# Patient Record
Sex: Male | Born: 1975 | Race: White | Hispanic: No | Smoking: Never smoker
Health system: Southern US, Community
[De-identification: ages and names within clinical notes are randomized; demographics above are authoritative.]

## PROBLEM LIST (undated history)

## (undated) DIAGNOSIS — I10 Essential (primary) hypertension: Secondary | ICD-10-CM

## (undated) DIAGNOSIS — E7800 Pure hypercholesterolemia, unspecified: Secondary | ICD-10-CM

## (undated) DIAGNOSIS — G4730 Sleep apnea, unspecified: Secondary | ICD-10-CM

## (undated) HISTORY — PX: FRACTURE SURGERY: SHX138

## (undated) HISTORY — DX: Sleep apnea, unspecified: G47.30

## (undated) HISTORY — DX: Essential (primary) hypertension: I10

## (undated) HISTORY — PX: LITHOTRIPSY: SUR834

## (undated) HISTORY — DX: Pure hypercholesterolemia, unspecified: E78.00

---

## 2005-06-12 HISTORY — PX: ANKLE FRACTURE SURGERY: SHX122

## 2006-01-03 ENCOUNTER — Emergency Department (HOSPITAL_COMMUNITY): Admission: EM | Admit: 2006-01-03 | Discharge: 2006-01-03 | Payer: Self-pay | Admitting: Emergency Medicine

## 2006-01-08 ENCOUNTER — Ambulatory Visit (HOSPITAL_COMMUNITY): Admission: RE | Admit: 2006-01-08 | Discharge: 2006-01-08 | Payer: Self-pay | Admitting: Orthopedic Surgery

## 2006-01-11 ENCOUNTER — Ambulatory Visit (HOSPITAL_COMMUNITY): Admission: RE | Admit: 2006-01-11 | Discharge: 2006-01-12 | Payer: Self-pay | Admitting: Orthopedic Surgery

## 2006-03-07 ENCOUNTER — Encounter: Admission: RE | Admit: 2006-03-07 | Discharge: 2006-03-07 | Payer: Self-pay | Admitting: Orthopedic Surgery

## 2006-03-26 ENCOUNTER — Ambulatory Visit (HOSPITAL_COMMUNITY): Admission: RE | Admit: 2006-03-26 | Discharge: 2006-03-27 | Payer: Self-pay | Admitting: Orthopedic Surgery

## 2007-05-11 ENCOUNTER — Emergency Department (HOSPITAL_COMMUNITY): Admission: EM | Admit: 2007-05-11 | Discharge: 2007-05-11 | Payer: Self-pay | Admitting: Emergency Medicine

## 2007-05-22 ENCOUNTER — Encounter: Admission: RE | Admit: 2007-05-22 | Discharge: 2007-05-22 | Payer: Self-pay | Admitting: Urology

## 2009-03-21 IMAGING — CT CT UROGRAM
2 of 3 series · 15 of 42 positions shown, 19 images · non-contrast
Comparison: NONE

CLINICAL DATA: Left-sided pain and hematuria. 

CT UROGRAM
TECHNIQUE: Thin-section unenhanced axial images were obtained to 
provide a CT urogram to evaluate for possible urinary tract stone. 
 Scan thicknesses were 3.0 mm with 3.0-mm increments.

[Series 2: wo · axial · 0.76mm/px · z∈[+45,+426]mm · 12 of 143 slices shown, 16 images]
[im 8/143  soft-tissue]
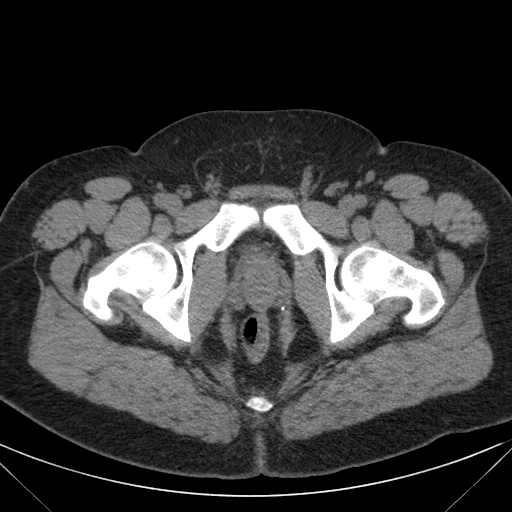
[im 8/143  bone]
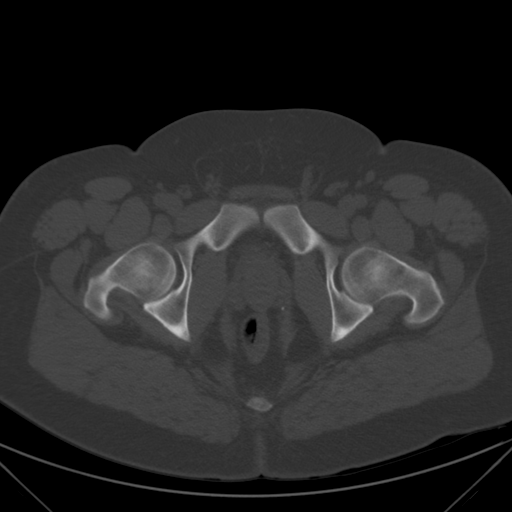
[im 23/143  soft-tissue]
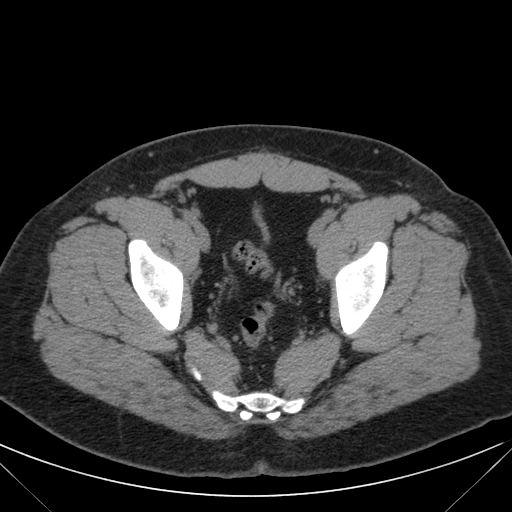
[im 38/143  soft-tissue]
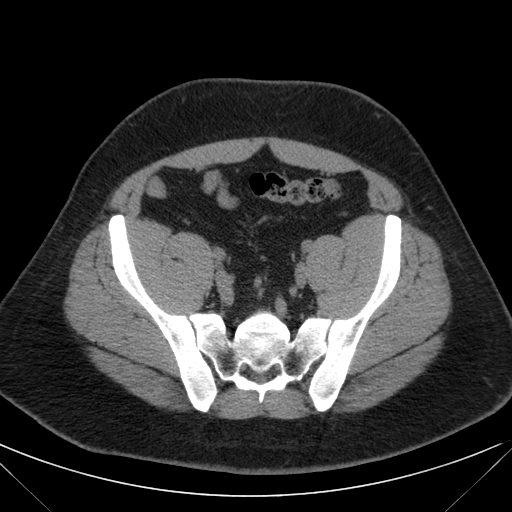
[im 53/143  soft-tissue]
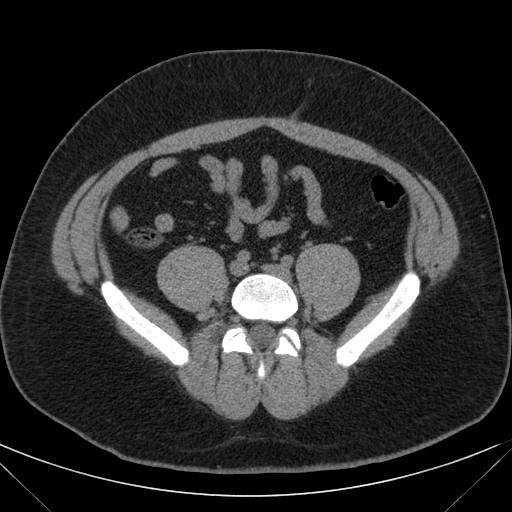
[im 68/143  soft-tissue]
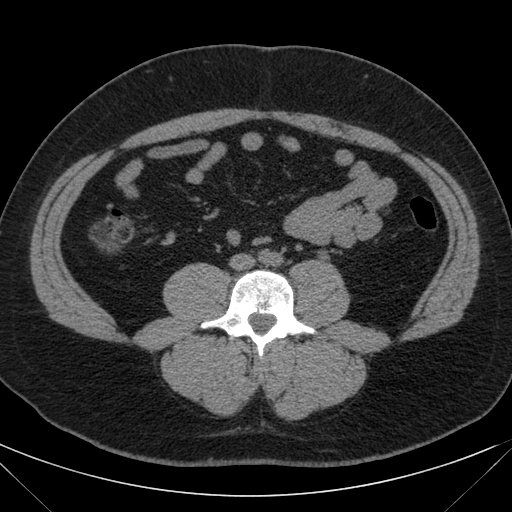
[im 75/143  soft-tissue]
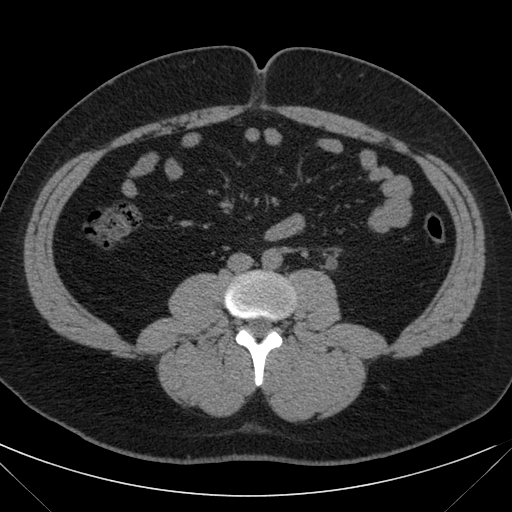
[im 90/143  soft-tissue]
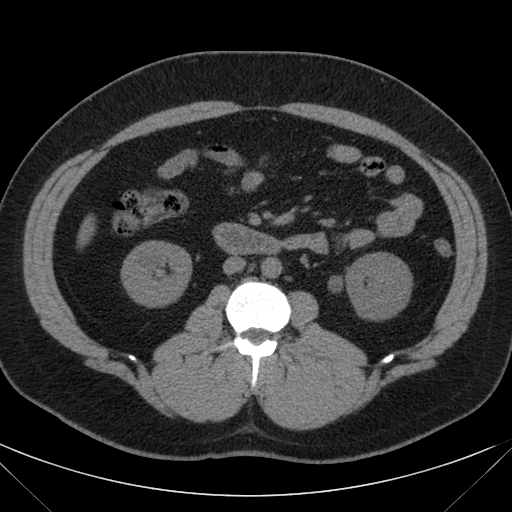
[im 105/143  soft-tissue]
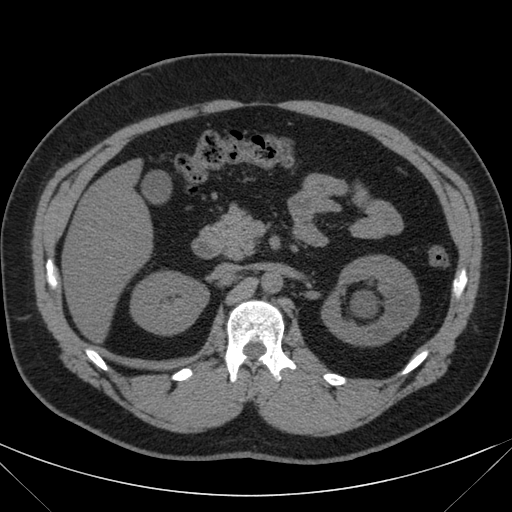
[im 113/143  lung]
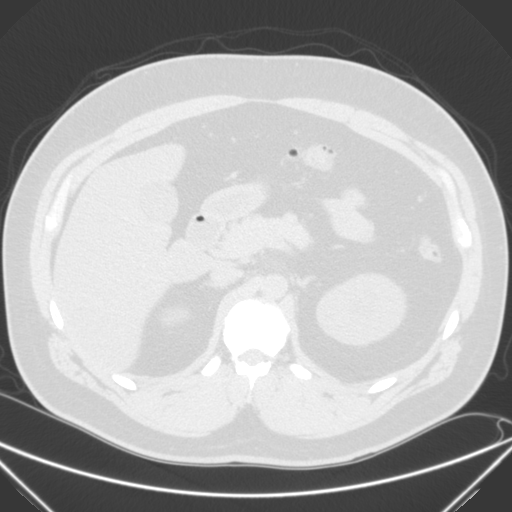
[im 120/143  soft-tissue]
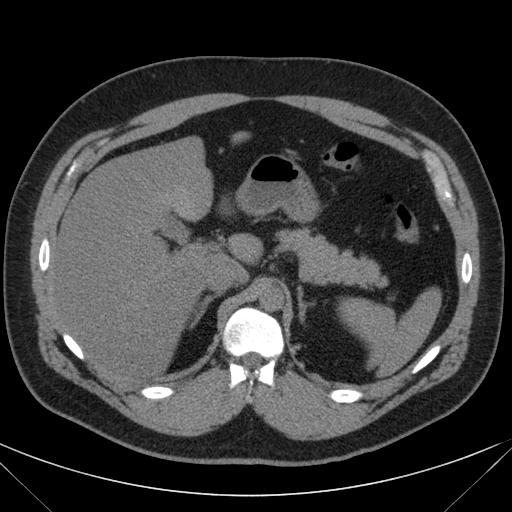
[im 120/143  lung]
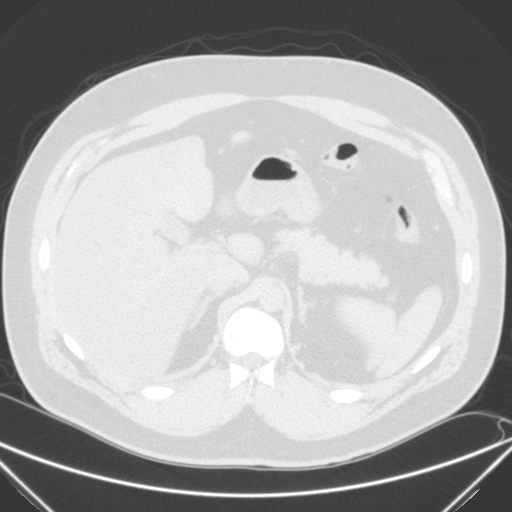
[im 120/143  bone]
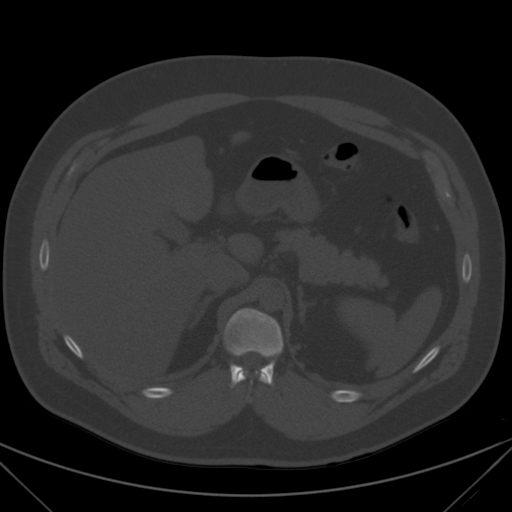
[im 128/143  lung]
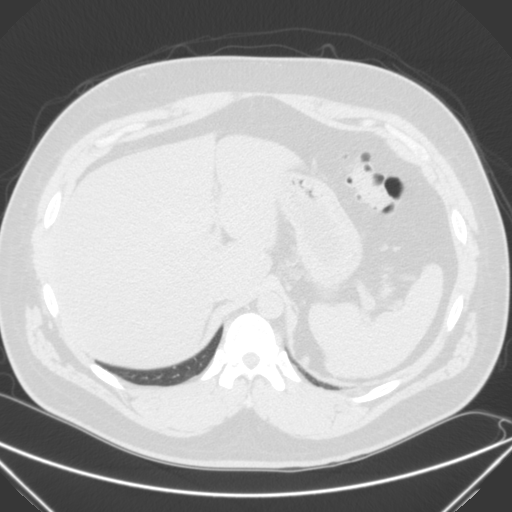
[im 135/143  soft-tissue]
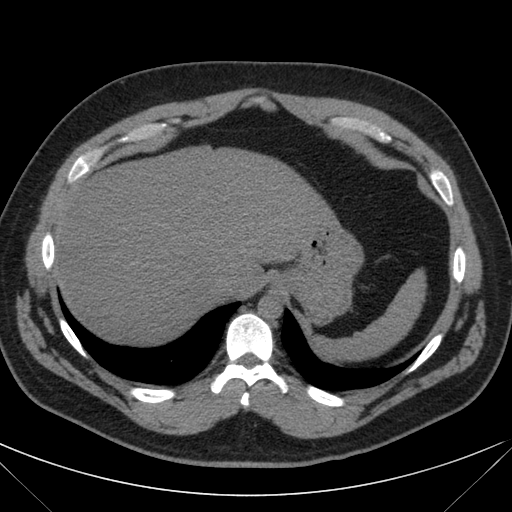
[im 135/143  lung]
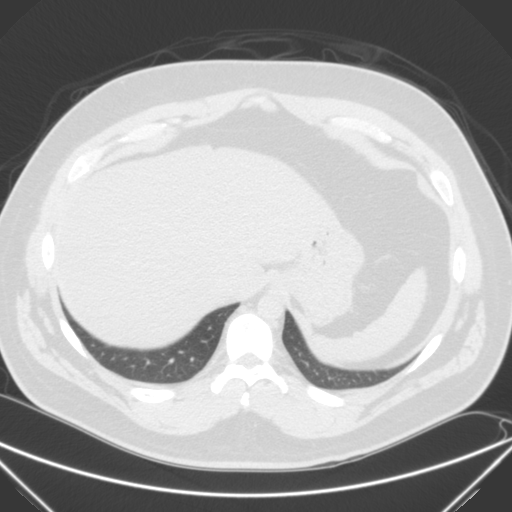

[coronals · coronal · 0.83mm/px · 3 of 82 slices shown]
[im 28/82  soft-tissue]
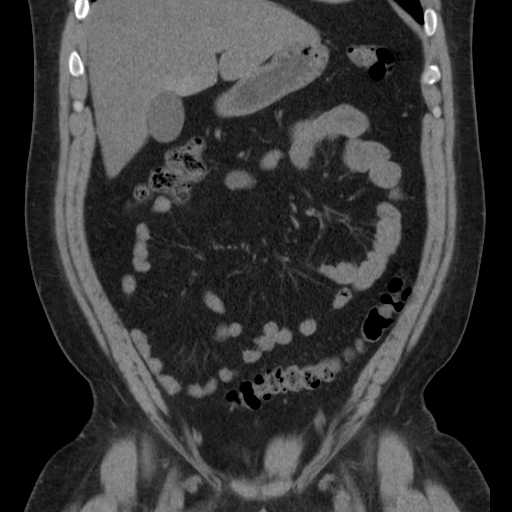
[im 37/82  soft-tissue]
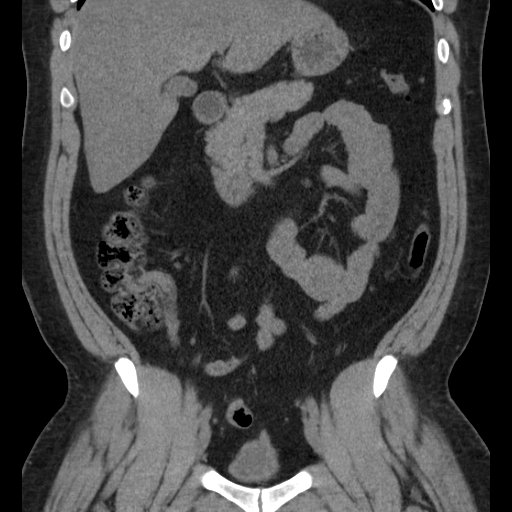
[im 46/82  soft-tissue]
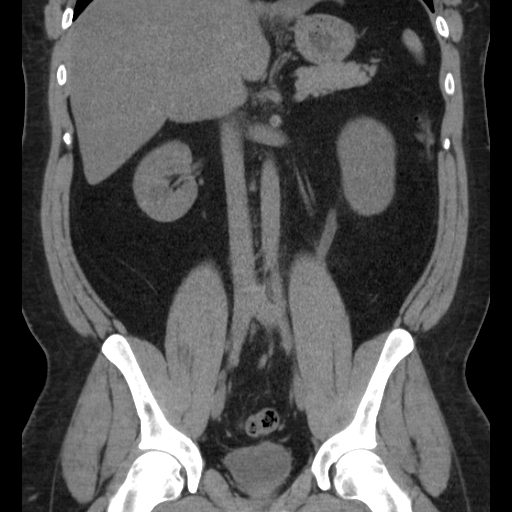

[15 of 42 positions shown; findings below may reference images not displayed]

FINDINGS: No gallstones or right renal calculi. There is marked 
caliectasis and ureterectasis on the left to the level of the 
proximal ureter, where there is a 4-5 mm calculus.  Note is also 
made of a large calculus in the midpole region of the left kidney, 
which measures at least 12 mm in size. No other ureteral calculi 
are noted. Visualized portions of the liver, pancreas, and spleen 
are unremarkable.  There is no evidence of abdominal aortic 
aneurysm.  No evidence of appendicitis, diverticulitis, hernia, or 
bowel obstruction.
IMPRESSION: Large left renal calculus. Obstructing proximal left 
ureteral calculus, 4-5 mm in size. Goldstein, [place] 
electronically reviewed on 05/13/2007 Dict Date: 05/13/2007  Tran 
Date:  05/13/2007 DAS  JLM

## 2009-06-28 ENCOUNTER — Ambulatory Visit (HOSPITAL_COMMUNITY): Admission: RE | Admit: 2009-06-28 | Discharge: 2009-06-28 | Payer: Self-pay | Admitting: Urology

## 2010-10-18 ENCOUNTER — Ambulatory Visit (HOSPITAL_BASED_OUTPATIENT_CLINIC_OR_DEPARTMENT_OTHER)
Admission: RE | Admit: 2010-10-18 | Discharge: 2010-10-18 | Disposition: A | Discharge status: Home / Self Care | Source: Ambulatory Visit | Attending: Plastic Surgery | Admitting: Plastic Surgery

## 2010-10-18 ENCOUNTER — Other Ambulatory Visit: Payer: Self-pay | Admitting: Plastic Surgery

## 2010-10-18 DIAGNOSIS — Z01810 Encounter for preprocedural cardiovascular examination: Secondary | ICD-10-CM | POA: Insufficient documentation

## 2010-10-18 DIAGNOSIS — D1739 Benign lipomatous neoplasm of skin and subcutaneous tissue of other sites: Secondary | ICD-10-CM | POA: Insufficient documentation

## 2010-10-18 LAB — POCT I-STAT, CHEM 8
BUN: 13 mg/dL (ref 6–23)
Chloride: 107 mEq/L (ref 96–112)
Glucose, Bld: 124 mg/dL — ABNORMAL HIGH (ref 70–99)
HCT: 44 % (ref 39.0–52.0)
Potassium: 4 mEq/L (ref 3.5–5.1)

## 2010-10-18 LAB — GLUCOSE, CAPILLARY: Glucose-Capillary: 113 mg/dL — ABNORMAL HIGH (ref 70–99)

## 2010-10-28 NOTE — Op Note (Signed)
Eric Farley, Eric Farley                ACCOUNT NO.:  [id number]   MEDICAL RECORD NO.:  [id number]          PATIENT TYPE:  OIB   LOCATION:  5002                         FACILITY:  [facility]   PHYSICIAN:  Doralee Albino. Carola Frost, M.D. DATE OF BIRTH:  1976/03/24   DATE OF PROCEDURE:  01/11/2006  DATE OF DISCHARGE:  01/12/2006                                 OPERATIVE REPORT   DIAGNOSES:  1. Left proximal fibular fracture.  2. Disrupted left ankle syndesmosis.   PROCEDURE:  Open treatment of left ankle syndesmosis using two screws.   SURGEON:  Doralee Albino. Carola Frost, M.D.   ASSISTANT:  Aura Fey. Bobbe Medico.   ANESTHESIA:  General.   COMPLICATIONS:  None.   TOURNIQUET:  None.   DISPOSITION:  To PACU.   CONDITION:  Stable.   BRIEF SUMMARY OF INDICATIONS FOR PROCEDURE:  Eric Farley is a 35 year old  male who sustained a severe left ankle injury at work.  X-rays demonstrated  complete widening of the tibiofibular space and translation of the talus  consistent with complete disruption of the syndesmosis.  After waiting for  clearance for surgery, Eric Farley was ready to proceed.  We discussed  preoperatively and he understood the risk of nerve injury, vessel injury,  infection, thromboembolic complications, possibility of loss of reduction  and symptomatic hardware, hardware breakage and need for further surgery  including possible hardware removal at a later date.  He wished to proceed.   DESCRIPTION OF PROCEDURE:  Eric Farley was taken to the operating room after  administration of preop antibiotics.  General anesthesia was induced.  His  left lower extremity was prepped and draped in the usual sterile fashion.  We then manipulated the ankle and attempted to reduce the syndesmosis given  the amount of time between his fracture and surgery.  We did need to use the  [facility] tong clamp which was placed through percutaneous stab incisions over  the fibula and medial metaphysis in order to compress and  reduce the  syndesmosis.  Appropriate reduction was checked on AP, mortise and lateral  films and once confirmed appropriate, two tricortical screws were placed  from the fibula into the tibia.  Final AP, mortise and lateral film showed  appropriate reduction and hardware placement.  The small incisions were then  irrigated and closed with 3-0 nylon.  Sterile gently compressive dressing  and then a posterior and stirrup splint were applied.  The patient was  awakened from anesthesia and taken to PACU in stable condition.   PROGNOSIS:  Eric Farley will most likely stay overnight given his sleep  apnea.  He should do well following this injury provided that he maintains  his nonweightbearing restrictions.  He should not need formal DVT  prophylaxis as he has been up and quite mobile.  He will be discharged home  on aspirin daily and of Percocet for pain control.  Will plan to see him  back in the clinic in 10 days or so and at that time convert him into a cam  boot to allow for mobilization of the ankle.  Doralee Albino. Carola Frost, M.D.  Electronically Signed     MHH/MEDQ  D:  01/25/2006  T:  01/25/2006  Job:  [id number]

## 2010-10-28 NOTE — Op Note (Signed)
[name], [name]                ACCOUNT NO.:  [id number]   MEDICAL RECORD NO.:  [id number]          PATIENT TYPE:  OIB   LOCATION:  5031                         FACILITY:  [facility]   PHYSICIAN:  Doralee Albino. Carola Frost, M.D. DATE OF BIRTH:  05/30/1976   DATE OF PROCEDURE:  03/27/2006  DATE OF DISCHARGE:  03/27/2006                                 OPERATIVE REPORT   PREOPERATIVE DIAGNOSIS:  Loss of reduction left ankle syndesmosis.   POSTOPERATIVE DIAGNOSIS:  Loss of reduction left ankle syndesmosis.   PROCEDURE:  1. Revision open reduction and internal fixation of left ankle      syndesmosis.  2. Removal of deep implant, left ankle.   SURGEON:  Doralee Albino. Carola Frost, M.D.   ASSISTANT:   ANESTHESIA:  General.   COMPLICATIONS:  None.   TOURNIQUET:  None.   ESTIMATED BLOOD LOSS:  Scant.   DISPOSITION:  To PACU.   CONDITION:  Stable.   INDICATIONS FOR PROCEDURE:  Eric Farley is a 35 year old male who sustained  a left ankle [place] fracture with disruption of the syndesmosis and an  associated deltoid ligament tear.  He was non-compliant in the postoperative  period early on but subsequently became compliant. He began very limited  partial weight-bearing around eight weeks and developed hardware breakage of  his syndesmotic screws with loss of reduction as confirmed by CT with slight  widening, around 5 mm.  After a discussion of the risks and benefits of  surgery, Eric Farley elected to proceed with removal of the fracture  hardware and revision internal fixation and reduction of the syndesmosis.   DESCRIPTION OF PROCEDURE:  Eric Farley was administered preop antibiotics  and taken to the operating room where general anesthesia was induced.  His  left lower extremity was prepped and draped in the usual sterile fashion.  The old lateral incision was reopened, had the screws identified, and the  screws removed without difficulty.  It should be noted prior to making an  incision, we  did perform a stress examination of the syndesmosis which  showed obvious widening of the syndesmotic interval with external rotation  stress.  The [facility] tong clamps were then used to help reduce and maintain  reduction which was confirmed on both AP and mortise x-rays using  appropriate landmarks.  Two screws were then placed from lateral to medial  obtaining of four cortices on the most proximal screw and being in the hard  subchondral bone distally.  No attempt was made to remove the interosseus  broken screw tip within the tibia.  The wound was copiously irrigated and  closed in a standard layered fashion using 2-0 Vicryl for the periosteum, 2-  0 PDS for the subcu layer, and 3-0 nylon for the skin.  A sterile gently  compressive dressing was applied.  The patient was then placed into a well  padded dressing and posterior stirrup splint with the ankle in equinus.  He  was then awakened from anesthesia and transported to the PACU in stable  condition.   PROGNOSIS:  Eric Farley fortunately did not require debridement  of the  syndesmotic interval nor opening of the medial tibiotalar joint.  Because we  were able to revise his fixation within a reasonable time period from loss  of reduction, this should translate into an uneventful recovery if he can  maintain non-weight bearing for the next eight weeks followed by partial  graduated weight bearing.  I will initially place him in a splint but will  begin allowing range of motion at two weeks.      Doralee Albino. Carola Frost, M.D.  Electronically Signed     MHH/MEDQ  D:  03/26/2006  T:  03/27/2006  Job:  [id number]

## 2011-05-28 ENCOUNTER — Ambulatory Visit: Payer: BC Managed Care – PPO

## 2011-05-28 DIAGNOSIS — J019 Acute sinusitis, unspecified: Secondary | ICD-10-CM

## 2011-07-20 ENCOUNTER — Telehealth: Payer: Self-pay

## 2011-07-20 NOTE — Telephone Encounter (Signed)
PT STATES HE CANNOT TASTE FOODS AS WELL ANYMORE, SOME DRY MOUTH AND NOTICES METALLIC AFTER TASTE AND TONGUE FEELS SORE

## 2011-07-21 NOTE — Telephone Encounter (Signed)
Spoke with pt and advised to rtc for eval. Pt understood and will be here today or tomorrow

## 2011-07-23 ENCOUNTER — Ambulatory Visit: Payer: BC Managed Care – PPO | Admitting: Family Medicine

## 2011-07-23 ENCOUNTER — Emergency Department (HOSPITAL_BASED_OUTPATIENT_CLINIC_OR_DEPARTMENT_OTHER)
Admission: EM | Admit: 2011-07-23 | Discharge: 2011-07-23 | Disposition: A | Discharge status: Home / Self Care | Payer: BC Managed Care – PPO | Attending: Emergency Medicine | Admitting: Emergency Medicine

## 2011-07-23 ENCOUNTER — Encounter (HOSPITAL_BASED_OUTPATIENT_CLINIC_OR_DEPARTMENT_OTHER): Payer: Self-pay

## 2011-07-23 DIAGNOSIS — Z79899 Other long term (current) drug therapy: Secondary | ICD-10-CM | POA: Insufficient documentation

## 2011-07-23 DIAGNOSIS — G4730 Sleep apnea, unspecified: Secondary | ICD-10-CM | POA: Insufficient documentation

## 2011-07-23 DIAGNOSIS — E1165 Type 2 diabetes mellitus with hyperglycemia: Secondary | ICD-10-CM

## 2011-07-23 DIAGNOSIS — E7800 Pure hypercholesterolemia, unspecified: Secondary | ICD-10-CM

## 2011-07-23 DIAGNOSIS — K219 Gastro-esophageal reflux disease without esophagitis: Secondary | ICD-10-CM

## 2011-07-23 DIAGNOSIS — I10 Essential (primary) hypertension: Secondary | ICD-10-CM | POA: Insufficient documentation

## 2011-07-23 DIAGNOSIS — N529 Male erectile dysfunction, unspecified: Secondary | ICD-10-CM

## 2011-07-23 DIAGNOSIS — E782 Mixed hyperlipidemia: Secondary | ICD-10-CM

## 2011-07-23 DIAGNOSIS — R739 Hyperglycemia, unspecified: Secondary | ICD-10-CM

## 2011-07-23 DIAGNOSIS — Z794 Long term (current) use of insulin: Secondary | ICD-10-CM | POA: Insufficient documentation

## 2011-07-23 DIAGNOSIS — E119 Type 2 diabetes mellitus without complications: Secondary | ICD-10-CM

## 2011-07-23 DIAGNOSIS — R5383 Other fatigue: Secondary | ICD-10-CM

## 2011-07-23 DIAGNOSIS — E1169 Type 2 diabetes mellitus with other specified complication: Secondary | ICD-10-CM | POA: Insufficient documentation

## 2011-07-23 LAB — LIPID PANEL
Cholesterol: 170 mg/dL (ref 0–200)
Total CHOL/HDL Ratio: 5.9 Ratio
Triglycerides: 776 mg/dL — ABNORMAL HIGH (ref <150)

## 2011-07-23 LAB — URINALYSIS, ROUTINE W REFLEX MICROSCOPIC
Glucose, UA: >1000 mg/dL — AB
Leukocytes, UA: NEGATIVE
Nitrite: NEGATIVE
Protein, ur: 100 mg/dL — AB
Urobilinogen, UA: 0.2 mg/dL (ref 0.0–1.0)

## 2011-07-23 LAB — GLUCOSE, CAPILLARY
Glucose-Capillary: 591 mg/dL (ref 70–99)
Glucose-Capillary: 458 mg/dL — ABNORMAL HIGH (ref 70–99)
Glucose-Capillary: 388 mg/dL — ABNORMAL HIGH (ref 70–99)
Glucose-Capillary: 340 mg/dL — ABNORMAL HIGH (ref 70–99)

## 2011-07-23 LAB — COMPREHENSIVE METABOLIC PANEL
Albumin: 4.6 g/dL (ref 3.5–5.2)
BUN: 18 mg/dL (ref 6–23)
Calcium: 9.5 mg/dL (ref 8.4–10.5)
Chloride: 86 mEq/L — ABNORMAL LOW (ref 96–112)
Creat: 1.14 mg/dL (ref 0.50–1.35)
Glucose, Bld: 489 mg/dL — ABNORMAL HIGH (ref 70–99)
Potassium: 4.2 mEq/L (ref 3.5–5.3)

## 2011-07-23 LAB — POCT URINALYSIS DIPSTICK
Leukocytes, UA: NEGATIVE
Nitrite, UA: NEGATIVE
Spec Grav, UA: 1.01
Urobilinogen, UA: 0.2

## 2011-07-23 LAB — BASIC METABOLIC PANEL
CO2: 22 mEq/L (ref 19–32)
Calcium: 9.8 mg/dL (ref 8.4–10.5)
Chloride: 81 mEq/L — ABNORMAL LOW (ref 96–112)
GFR calc Af Amer: >90 mL/min (ref >90)
Sodium: 125 mEq/L — ABNORMAL LOW (ref 135–145)

## 2011-07-23 LAB — DIFFERENTIAL
Basophils Relative: 0 % (ref 0–1)
Lymphs Abs: 2.7 10*3/uL (ref 0.7–4.0)
Monocytes Relative: 9 % (ref 3–12)
Neutro Abs: 6.2 10*3/uL (ref 1.7–7.7)
Neutrophils Relative %: 63 % (ref 43–77)

## 2011-07-23 LAB — POCT UA - MICROSCOPIC ONLY
Casts, Ur, LPF, POC: NEGATIVE
Crystals, Ur, HPF, POC: NEGATIVE
Yeast, UA: NEGATIVE

## 2011-07-23 LAB — POCT GLYCOSYLATED HEMOGLOBIN (HGB A1C): Hemoglobin A1C: >14

## 2011-07-23 LAB — CBC
Hemoglobin: 16.2 g/dL (ref 13.0–17.0)
RBC: 6.24 MIL/uL — ABNORMAL HIGH (ref 4.22–5.81)

## 2011-07-23 LAB — HEMOGLOBIN A1C: Hgb A1c MFr Bld: 14.5 % — ABNORMAL HIGH (ref <5.7)

## 2011-07-23 LAB — URINE MICROSCOPIC-ADD ON

## 2011-07-23 MED ORDER — OMEPRAZOLE 20 MG PO CPDR: 20.0000 mg | DELAYED_RELEASE_CAPSULE | Freq: Every day | ORAL | Status: DC

## 2011-07-23 MED ORDER — INSULIN PEN NEEDLE 31G X 8 MM MISC: 1.0000 | Freq: Every day | Status: DC

## 2011-07-23 MED ORDER — INSULIN GLARGINE 100 UNIT/ML ~~LOC~~ SOLN: SUBCUTANEOUS | Status: DC

## 2011-07-23 MED ORDER — LISINOPRIL-HYDROCHLOROTHIAZIDE 10-12.5 MG PO TABS: 1.0000 | ORAL_TABLET | Freq: Every day | ORAL | Status: DC

## 2011-07-23 MED ORDER — METFORMIN HCL 1000 MG PO TABS: 1000.0000 mg | ORAL_TABLET | Freq: Two times a day (BID) | ORAL | Status: DC

## 2011-07-23 MED ORDER — SODIUM CHLORIDE 0.9 % IV BOLUS (SEPSIS): 1000.0000 mL | Freq: Once | INTRAVENOUS | Status: DC

## 2011-07-23 MED ORDER — SIMVASTATIN 40 MG PO TABS: 40.0000 mg | ORAL_TABLET | Freq: Every evening | ORAL | Status: DC

## 2011-07-23 MED ORDER — SODIUM CHLORIDE 0.9 % IV BOLUS (SEPSIS)
1000.0000 mL | Freq: Once | INTRAVENOUS | Status: AC
  Administered 2011-07-23: 1000 mL via INTRAVENOUS

## 2011-07-23 MED ORDER — SODIUM CHLORIDE 0.9 % IV SOLN
Freq: Once | INTRAVENOUS | Status: AC
  Administered 2011-07-23: 14:00:00 via INTRAVENOUS

## 2011-07-23 MED ORDER — INSULIN REGULAR HUMAN 100 UNIT/ML IJ SOLN
10.0000 [IU] | Freq: Once | INTRAMUSCULAR | Status: AC
  Administered 2011-07-23: 10 [IU] via INTRAVENOUS
  Filled 2011-07-23: qty 3

## 2011-07-23 NOTE — ED Notes (Signed)
The patients CBG was 591.

## 2011-07-23 NOTE — Progress Notes (Signed)
Subjective:    Patient ID: Eric Farley, male    DOB: 19-Jul-1975, 36 y.o.   MRN: [id number]  Diabetes He presents for his follow-up diabetic visit. He has type 2 diabetes mellitus. No MedicAlert identification noted. His disease course has been worsening. Associated symptoms include fatigue, foot paresthesias (rare), polydipsia (signifcantly worsening over the last 2 wks), polyuria (at least once a hr, significantly worse over last 2 weeks), weakness and weight loss (planned with diet and exercise). Pertinent negatives for diabetes include no blurred vision, no chest pain, no foot ulcerations, no polyphagia and no visual change. Symptoms are worsening. Symptoms have been present for 2 weeks. Current diabetic treatment includes diet and oral agent (monotherapy). He is compliant with treatment all of the time. His weight is decreasing steadily. He monitors blood glucose at home 1-2 x per week. His overall blood glucose range is >200 mg/dl. An ACE inhibitor/angiotensin II receptor blocker is being taken.  Erectile Dysfunction This is a new problem. The current episode started 1 to 4 weeks ago. The problem has been gradually worsening since onset. The nature of his difficulty is achieving erection, maintaining erection and penetration. Irritative symptoms include frequency. Past treatments include nothing. Risk factors include diabetes mellitus.  Pt also experiencing significant fatigue and lack of energy.    Review of Systems  Constitutional: Positive for weight loss (planned with diet and exercise) and fatigue.  Eyes: Negative for blurred vision.  Cardiovascular: Negative for chest pain.  Genitourinary: Positive for polyuria (at least once a hr, significantly worse over last 2 weeks) and frequency.  Neurological: Positive for weakness.  Hematological: Positive for polydipsia (signifcantly worsening over the last 2 wks). Negative for polyphagia.       Objective:   Physical Exam    Constitutional: He is oriented to person, place, and time. He appears well-developed and well-nourished.  HENT:  Head: Normocephalic and atraumatic.  Right Ear: External ear normal.  Left Ear: External ear normal.  Eyes: Pupils are equal, round, and reactive to light.  Cardiovascular: Normal rate, regular rhythm and normal heart sounds.   Pulmonary/Chest: Effort normal and breath sounds normal.  Neurological: He is alert and oriented to person, place, and time. No sensory deficit.  Skin: Skin is warm and dry.  Psychiatric: He has a normal mood and affect. His behavior is normal. Judgment and thought content normal.   -normal sensation with filament testing B feet.       Assessment & Plan:   1. DM type 2 (diabetes mellitus, type 2)  POCT glucose (manual entry), POCT glycosylated hemoglobin (Hb A1C), Lipid panel, Comprehensive metabolic panel, Testosterone, free, total, POCT UA - Microscopic Only, POCT urinalysis dipstick, Hemoglobin A1c  2. HTN (hypertension)  Comprehensive metabolic panel, lisinopril-hydrochlorothiazide (PRINZIDE,ZESTORETIC) 10-12.5 MG per tablet  3. Hypercholesteremia  Lipid panel, simvastatin (ZOCOR) 40 MG tablet  4. Fatigue  Testosterone, free, total  5. Impotence  Testosterone, free, total  6. DM (diabetes mellitus), type 2, uncontrolled  metFORMIN (GLUCOPHAGE) 1000 MG tablet, insulin glargine (LANTUS SOLOSTAR) 100 UNIT/ML injection, Insulin Pen Needle 31G X 8 MM MISC  7. GERD (gastroesophageal reflux disease)  omeprazole (PRILOSEC) 20 MG capsule    Meds refilled.  DM significantly worse since last OV 11/12 where HgA1C 7.7 and todays >14 with glucose >444 worried that he has almost no pancreatic function and suffering from dehydration with significant weight loss (~30lbs in <8 wks) - pt will go to ED for fluids and insulin along with accurate glucose reading.  Will start Lantus at home and increase metformin.  Pt will f/u in 1 wk to monitor status.

## 2011-07-23 NOTE — ED Provider Notes (Signed)
History     CSN: [id number]  Arrival date & time 07/23/11  1153   First MD Initiated Contact with Patient 07/23/11 1308      Chief Complaint  Patient presents with  . Blood Sugar Problem    (Consider location/radiation/quality/duration/timing/severity/associated sxs/prior treatment) The history is provided by the patient.   36 year old male was diagnosed as being diabetic 6 months ago. For the last 2 weeks, he has had polyuria and polydipsia. He has been checking his sugars and they have been running over 300. Yesterday his blood sugar at home just read high. He went to urgent care today where he was told that his pancreas is not making any insulin and he had to come to the emergency department. He denies fever, chills, sweats. He denies any nausea, vomiting, diarrhea. Symptoms are severe. Nothing makes it better nothing makes it worse. He is currently taking metformin 500 mg twice a day. He was given an insulin pen by urgent care before being sent to the emergency department. He denies any blurred vision. He also has history of hypertension and hyperlipidemia.  Past Medical History  Diagnosis Date  . Sleep apnea     does not like CPAP  . HTN (hypertension)   . Hypercholesterolemia   . Diabetes mellitus     Past Surgical History  Procedure Date  . Ankle fracture surgery 2007  . Lithotripsy     History reviewed. No pertinent family history.  History  Substance Use Topics  . Smoking status: Never Smoker   . Smokeless tobacco: Not on file  . Alcohol Use: No      Review of Systems  All other systems reviewed and are negative.    Allergies  Review of patient's allergies indicates no known allergies.  Home Medications   Current Outpatient Rx  Name Route Sig Dispense Refill  . INSULIN GLARGINE 100 UNIT/ML [place] SOLN  Pt to start at 15U qhs and increase by 2 units every 2 nights until am glucose <140 5 pen 0  . INSULIN PEN NEEDLE 31G X 8 MM MISC Does not apply 1 each by  Does not apply route daily. 100 each 0  . LISINOPRIL-HYDROCHLOROTHIAZIDE 10-12.5 MG PO TABS Oral Take 1 tablet by mouth daily. 30 tablet 2  . METFORMIN HCL 1000 MG PO TABS Oral Take 1 tablet (1,000 mg total) by mouth 2 (two) times daily with a meal. 60 tablet 2  . OMEPRAZOLE 20 MG PO CPDR Oral Take 1 capsule (20 mg total) by mouth daily. 30 capsule 2  . SIMVASTATIN 40 MG PO TABS Oral Take 1 tablet (40 mg total) by mouth every evening. 30 tablet 2    BP 148/94  Pulse 89  Temp(Src) 98.3 F (36.8 C) (Oral)  Resp 16  Ht 5\' 9"  (1.753 m)  Wt 240 lb (108.863 kg)  BMI 35.44 kg/m2  SpO2 98%  Physical Exam  Nursing note and vitals reviewed.  overweight 36 year old male who is resting comfortably and in no acute distress. Vital signs show mild hypertension with blood pressure 140/94. Oxygen saturation is 98% which is normal. Head is normocephalic and atraumatic. PERRLA, EOMI. Done extremities are moist. Neck is supple without adenopathy or JVD. Back is nontender. Lungs are clear without rales, wheezes, or rhonchi. Heart has regular rate and rhythm without murmur. Abdomen is obese, soft, nontender without masses or hepatosplenomegaly. Extremities have no cyanosis or edema, full range of motion is present. Skin is warm and dry without rash. Neurologic: Mental status  is normal, cranial nerves are intact, there no focal motor or sensory deficits.  ED Course  Procedures (including critical care time)  Results for orders placed during the hospital encounter of 07/23/11  GLUCOSE, CAPILLARY      Component Value Range   Glucose-Capillary 591 (*) 70 - 99 (mg/dL)   Comment 1 Notify RN     Comment 2 Documented in Chart    CBC      Component Value Range   WBC 9.9  4.0 - 10.5 (K/uL)   RBC 6.24 (*) 4.22 - 5.81 (MIL/uL)   Hemoglobin 16.2  13.0 - 17.0 (g/dL)   HCT 65.7  84.6 - 96.2 (%)   MCV 72.4 (*) 78.0 - 100.0 (fL)   MCH 26.0  26.0 - 34.0 (pg)   MCHC 35.8  30.0 - 36.0 (g/dL)   RDW 95.2  84.1 - 32.4 (%)    Platelets 339  150 - 400 (K/uL)  DIFFERENTIAL      Component Value Range   Neutrophils Relative 63  43 - 77 (%)   Neutro Abs 6.2  1.7 - 7.7 (K/uL)   Lymphocytes Relative 28  12 - 46 (%)   Lymphs Abs 2.7  0.7 - 4.0 (K/uL)   Monocytes Relative 9  3 - 12 (%)   Monocytes Absolute 0.9  0.1 - 1.0 (K/uL)   Eosinophils Relative 1  0 - 5 (%)   Eosinophils Absolute 0.1  0.0 - 0.7 (K/uL)   Basophils Relative 0  0 - 1 (%)   Basophils Absolute 0.0  0.0 - 0.1 (K/uL)   WBC Morphology ATYPICAL LYMPHOCYTES     RBC Morphology POLYCHROMASIA PRESENT    BASIC METABOLIC PANEL      Component Value Range   Sodium 125 (*) 135 - 145 (mEq/L)   Potassium 3.5  3.5 - 5.1 (mEq/L)   Chloride 81 (*) 96 - 112 (mEq/L)   CO2 22  19 - 32 (mEq/L)   Glucose, Bld 485 (*) 70 - 99 (mg/dL)   BUN 18  6 - 23 (mg/dL)   Creatinine, Ser 4.01  0.50 - 1.35 (mg/dL)   Calcium 9.8  8.4 - 02.7 (mg/dL)   GFR calc non Af Amer >90  >90 (mL/min)   GFR calc Af Amer >90  >90 (mL/min)  URINALYSIS, ROUTINE W REFLEX MICROSCOPIC      Component Value Range   Color, Urine YELLOW  YELLOW    APPearance CLEAR  CLEAR    Specific Gravity, Urine 1.029  1.005 - 1.030    pH 5.5  5.0 - 8.0    Glucose, UA >1000 (*) NEGATIVE (mg/dL)   Hgb urine dipstick TRACE (*) NEGATIVE    Bilirubin Urine NEGATIVE  NEGATIVE    Ketones, ur >80 (*) NEGATIVE (mg/dL)   Protein, ur 253 (*) NEGATIVE (mg/dL)   Urobilinogen, UA 0.2  0.0 - 1.0 (mg/dL)   Nitrite NEGATIVE  NEGATIVE    Leukocytes, UA NEGATIVE  NEGATIVE   GLUCOSE, CAPILLARY      Component Value Range   Glucose-Capillary 458 (*) 70 - 99 (mg/dL)  URINE MICROSCOPIC-ADD ON      Component Value Range   Squamous Epithelial / LPF FEW (*) RARE    WBC, UA 0-2  <3 (WBC/hpf)   RBC / HPF 0-2  <3 (RBC/hpf)   Bacteria, UA FEW (*) RARE   GLUCOSE, CAPILLARY      Component Value Range   Glucose-Capillary 417 (*) 70 - 99 (mg/dL)  GLUCOSE, CAPILLARY  Component Value Range   Glucose-Capillary 388 (*) 70 - 99  (mg/dL)   No results found.  Blood sugar has come down with IV hydration and a single dose of insulin. He does not show any evidence of ketoacidosis on the left leg. His doctor at the urgent care Center had already told him to increase his metformin to 1000 mg twice a day and have instructed him on increasing his insulin dose every 2 days until he gets glycemic control. He has a followup appointment scheduled in 7 days. He is discharged to follow the above noted regimen.  1. Hyperglycemia       MDM  Hyperglycemia secondary to poor control of diabetes. Clinically, he does not appear to have ketoacidosis. Laboratory evaluation redone and we given IV hydration and will be given insulin as needed to lower his blood sugar to a safe level.        Dione Booze, MD 07/23/11 1622

## 2011-07-23 NOTE — ED Notes (Signed)
MD at bedside. 

## 2011-07-23 NOTE — ED Notes (Signed)
Pt states that he was sent here by urgent care on [address].  Pt states that he is told his pancreas is not producing any insulin and that he is urinating frequently, severe thirst, and was told he needs fluid replacement.

## 2011-07-24 LAB — TESTOSTERONE, FREE, TOTAL, SHBG
Sex Hormone Binding: 12 nmol/L — ABNORMAL LOW (ref 13–71)
Testosterone, Free: 63.4 pg/mL (ref 47.0–244.0)
Testosterone-% Free: 3 % — ABNORMAL HIGH (ref 1.6–2.9)
Testosterone: 211.25 ng/dL — ABNORMAL LOW (ref 250–890)

## 2011-07-26 ENCOUNTER — Telehealth: Payer: Self-pay | Admitting: Family Medicine

## 2011-07-26 NOTE — Telephone Encounter (Signed)
Pt called and states he is not feeling better, says he feels levels have not come down and still feels dehydrated Please call pt to advise what to do

## 2011-07-26 NOTE — Telephone Encounter (Signed)
Please call patient and ask what his blood sugars are. Benny Lennert is familiar with patient and can see her tonight or tomorrow. I suspect he will need IV fluids and additional insulin.

## 2011-07-27 ENCOUNTER — Ambulatory Visit (INDEPENDENT_AMBULATORY_CARE_PROVIDER_SITE_OTHER): Payer: BC Managed Care – PPO | Admitting: Emergency Medicine

## 2011-07-27 VITALS — BP 120/78 | HR 102 | Temp 98.6°F | Resp 20 | Ht 69.5 in | Wt 240.6 lb

## 2011-07-27 DIAGNOSIS — R7309 Other abnormal glucose: Secondary | ICD-10-CM

## 2011-07-27 DIAGNOSIS — E119 Type 2 diabetes mellitus without complications: Secondary | ICD-10-CM

## 2011-07-27 DIAGNOSIS — R739 Hyperglycemia, unspecified: Secondary | ICD-10-CM

## 2011-07-27 DIAGNOSIS — E871 Hypo-osmolality and hyponatremia: Secondary | ICD-10-CM

## 2011-07-27 LAB — POCT URINALYSIS DIPSTICK
Leukocytes, UA: NEGATIVE
Nitrite, UA: NEGATIVE
Protein, UA: >=300
Urobilinogen, UA: 0.2
pH, UA: 6.5

## 2011-07-27 LAB — GLUCOSE, POCT (MANUAL RESULT ENTRY): POC Glucose: 376

## 2011-07-27 NOTE — Telephone Encounter (Signed)
Spoke with pt who reports all of his BS readings, even fasting, are >300. Instructed pt to RTC to see Luis Abed today. Pt agreed to come on over.

## 2011-07-27 NOTE — Telephone Encounter (Signed)
LMOM to CB. 

## 2011-07-28 LAB — BASIC METABOLIC PANEL
Calcium: 10.2 mg/dL (ref 8.4–10.5)
Creat: 1.13 mg/dL (ref 0.50–1.35)
Sodium: 133 mEq/L — ABNORMAL LOW (ref 135–145)

## 2011-07-28 NOTE — Telephone Encounter (Signed)
Benny Lennert PA: Pt would like to report his blood sugar reading from this morning, the reading was 364.

## 2011-07-28 NOTE — Telephone Encounter (Signed)
LMOM FOR PT TO C/B.

## 2011-07-28 NOTE — Telephone Encounter (Signed)
Explained instructions from Maralyn Sago to pt who verbalized agreement. Pt stated he has already talked with someone about his lab results.

## 2011-07-28 NOTE — Telephone Encounter (Signed)
This person also has a lab result in the lab pool.  I would increase his Lantus to 30U this evening and then start the q2d increase.  The no taste and metallitc flavor is related to his elevatd glucose.  This should get better.

## 2011-07-28 NOTE — Progress Notes (Signed)
  Subjective:    Patient ID: Eric Farley, male    DOB: [birth year], 36 y.o.   MRN: [id number]  HPI Pt presents for recheck after his last visit which resulted in IV fluids at ED due to glucose 591.  He has started his Lantus and increased his metformin and feels a little better but still very thirsty and lethargic.  His sugars have been in the high 300s at home.   Review of Systems  Constitutional: Positive for fatigue. Negative for fever and chills.  Genitourinary: Positive for frequency.       Objective:   Physical Exam  Constitutional: He is oriented to person, place, and time. He appears well-developed and well-nourished.  HENT:  Head: Normocephalic and atraumatic.  Right Ear: External ear normal.  Left Ear: External ear normal.  Cardiovascular: Normal rate, regular rhythm and normal heart sounds.   Pulmonary/Chest: Effort normal and breath sounds normal.  Neurological: He is alert and oriented to person, place, and time.  Skin: Skin is warm and dry.  Psychiatric: He has a normal mood and affect. His behavior is normal. Judgment normal.          Assessment & Plan:  . 1. Hyperglycemia  POCT glucose (manual entry), POCT urinalysis dipstick, Ambulatory referral to Endocrinology, Basic metabolic panel  2. DM (diabetes mellitus)  Ambulatory referral to Endocrinology  3. Acute hyponatremia  Basic metabolic panel   Reviewed pt's labs with him and with low testosterone will refer to endo. Due to a quick change in his glucose levels and sig weight loss ?DM 2 with complete pancreatic failure  - will refer to endo for help with insulin and glucose control.  Pt is in agreement.  Plan for the next 3 days is to increase his Lantus to 28U and then for him to increase by 2U q2d until am glucose is <140 (pt will hopefully get into endo for help with insulin and glucose regulation.  Pt will continue Metformin 1000mg  bid.  Pt written out of work until Monday due to feeling poorly.   D/w pt in length diet and changes that he can/should make.  Spent close to 1h with pt.

## 2011-08-01 ENCOUNTER — Telehealth: Payer: Self-pay

## 2011-08-02 NOTE — Telephone Encounter (Signed)
Pt is concerned regarding his glucose. Taking insulin as rx'd--2 units every other day. Pt is going to see the endocrinologist next week.

## 2011-08-02 NOTE — Telephone Encounter (Signed)
Pt states that this morning his blood sugar reading was 355 and yesterday it was 364, pt is concerned about this and would like to speak with someone regarding this.

## 2011-08-03 NOTE — Telephone Encounter (Signed)
Plan for the next 3 days is to increase his Lantus to 28U and then for him to increase by 2U q2d until am glucose is <140   The above is copied from his OV.  He should have increased the Lantus to 28 u, and then INCREASED that by 2 u QOD.  So, by now he should be on 34 or 36 u daily.

## 2011-08-03 NOTE — Telephone Encounter (Signed)
Gave pt new instructions to inc insulin. Pt verbalized understanding and agreement. Pt will CB if still doesn't respond to inc dose and RTC if Sxs worsen.

## 2011-08-03 NOTE — Telephone Encounter (Signed)
Discussed with Dr. Cleta Alberts.  Advise patient to increase Lantus to 38 units, then increase by 4 units QD until his appointment with endocrinology, or fasting sugar is less than 140.  Our current goal is to keep him out of danger until he can see the endocrinologist, who will make more aggressive changes.

## 2011-08-03 NOTE — Telephone Encounter (Signed)
Spoke with pt who reports he did follow instrs given at OV and increased to 28U then and inc by 2U QOD. He is now up to 34U as of yesterday. His fasting BS was 314 this am. He does have appt w/endocrinologist next Wed. But doesn't know what to do until then. Still feeling queasy and not much energy except for occasional "spurts". Please advise.

## 2011-08-11 ENCOUNTER — Telehealth: Payer: Self-pay

## 2011-08-11 NOTE — Telephone Encounter (Signed)
Pt calling to check on flma papers was told they'd be ready today.

## 2011-08-15 ENCOUNTER — Ambulatory Visit: Payer: BC Managed Care – PPO | Admitting: Physician Assistant

## 2011-08-15 VITALS — BP 114/83 | HR 80 | Temp 98.2°F | Resp 18 | Ht 69.5 in | Wt 241.0 lb

## 2011-08-15 DIAGNOSIS — R05 Cough: Secondary | ICD-10-CM

## 2011-08-15 DIAGNOSIS — J019 Acute sinusitis, unspecified: Secondary | ICD-10-CM

## 2011-08-15 MED ORDER — IPRATROPIUM BROMIDE 0.06 % NA SOLN: 2.0000 | Freq: Three times a day (TID) | NASAL | Status: DC

## 2011-08-15 MED ORDER — AZITHROMYCIN 250 MG PO TABS: ORAL_TABLET | ORAL | Status: AC

## 2011-08-15 MED ORDER — HYDROCODONE-HOMATROPINE 5-1.5 MG/5ML PO SYRP: ORAL_SOLUTION | ORAL | Status: AC

## 2011-08-15 NOTE — Progress Notes (Signed)
Patient ID: Eric Farley MRN: [id number], DOB: 1976/05/18, 36 y.o. Date of Encounter: 08/15/2011, 3:26 PM  Primary Physician: Virgilio Belling, PA  Chief Complaint:  Chief Complaint  Patient presents with  . Sinusitis    x 1 week    HPI: 36 y.o. year old male presents with 1 week history of worsening nasal congestion, post nasal drip, sinus pressure, and cough. Afebrile. No chills. Nasal congestion is thick and green. Cough is nonproductive. Cough is worse in the morning. Sinus pressure is located along left maxillary sinus. No sore throat. Symptoms began as typical URI and developed into the above. No GI complaints. Has tried mucinex and coricidin HBP.  DM being followed by Dr. Lucianne Muss. Sugars down to low 200's daily. Currently using 48 units of Lantus qhs and Apidra 30 units tid. Eating healthier, starting to exercise. Next follow up 08/29/11.   No leg trauma, sedentary periods, h/o cancer, or tobacco use.  Past Medical History  Diagnosis Date  . Sleep apnea     does not like CPAP  . HTN (hypertension)   . Hypercholesterolemia   . Diabetes mellitus      Home Meds: Prior to Admission medications   Medication Sig Start Date End Date Taking? Authorizing Provider  insulin glargine (LANTUS SOLOSTAR) 100 UNIT/ML injection Pt to start at 15U qhs and increase by 2 units every 2 nights until am glucose <140 07/23/11  Yes Benny Lennert, PA-C  insulin glulisine (APIDRA) 100 UNIT/ML injection Inject 30 Units into the skin 3 (three) times daily before meals.   Yes Historical Provider, MD  Insulin Pen Needle 31G X 8 MM MISC 1 each by Does not apply route daily. 07/23/11  Yes Benny Lennert, PA-C  lisinopril-hydrochlorothiazide (PRINZIDE,ZESTORETIC) 10-12.5 MG per tablet Take 1 tablet by mouth daily. 07/23/11  Yes Benny Lennert, PA-C  metFORMIN (GLUCOPHAGE) 1000 MG tablet Take 1 tablet (1,000 mg total) by mouth 2 (two) times daily with a meal. 07/23/11  Yes Benny Lennert, PA-C  omeprazole (PRILOSEC)  20 MG capsule Take 1 capsule (20 mg total) by mouth daily. 07/23/11  Yes Benny Lennert, PA-C  simvastatin (ZOCOR) 40 MG tablet Take 1 tablet (40 mg total) by mouth every evening. 07/23/11  Yes Benny Lennert, PA-C  azithromycin (ZITHROMAX Z-PAK) 250 MG tablet 2 tabs po first day, then 1 tab po next 4 days 08/15/11 08/20/11  Eula Listen, PA-C  HYDROcodone-homatropine [facility]) 5-1.5 MG/5ML syrup 1 TSP PO Q 4-6 HOURS PRN COUGH 08/15/11 08/25/11  Makena Mcgrady, PA-C  ipratropium (ATROVENT) 0.06 % nasal spray Place 2 sprays into the nose 3 (three) times daily. 08/15/11 08/14/12  Eula Listen, PA-C    Allergies: No Known Allergies  History   Social History  . Marital Status: Married    Spouse Name: N/A    Number of Children: N/A  . Years of Education: N/A   Occupational History  . Not on file.   Social History Main Topics  . Smoking status: Never Smoker   . Smokeless tobacco: Not on file  . Alcohol Use: No  . Drug Use: No  . Sexually Active: Not on file   Other Topics Concern  . Not on file   Social History Narrative  . No narrative on file     Review of Systems: Constitutional: negative for chills, fever, night sweats or weight changes Cardiovascular: negative for chest pain or palpitations Respiratory: negative for hemoptysis, wheezing, or shortness of breath Abdominal: negative for abdominal pain, nausea, vomiting or  diarrhea Dermatological: negative for rash Neurologic: negative for headache   Physical Exam: Blood pressure 114/83, pulse 80, temperature 98.2 F (36.8 C), temperature source Oral, resp. rate 18, height 5' 9.5" (1.765 m), weight 241 lb (109.317 kg)., Body mass index is 35.08 kg/(m^2). General: Well developed, well nourished, in no acute distress. Head: Normocephalic, atraumatic, eyes without discharge, sclera non-icteric, nares are congested. Bilateral auditory canals clear, TM's are without perforation, pearly grey with reflective cone of light bilaterally. Serous effusion  bilaterally behind TM's. Left maxillary sinus TTP. Oral cavity moist, dentition normal. Posterior pharynx with post nasal drip and mild erythema. No peritonsillar abscess or tonsillar exudate. Neck: Supple. No thyromegaly. Full ROM. No lymphadenopathy. Lungs: Clear bilaterally to auscultation without wheezes, rales, or rhonchi. Breathing is unlabored. Heart: RRR with S1 S2. No murmurs, rubs, or gallops appreciated. Msk:  Strength and tone normal for age. Extremities: No clubbing or cyanosis. No edema. Neuro: Alert and oriented X 3. Moves all extremities spontaneously. CNII-XII grossly in tact. Psych:  Responds to questions appropriately with a normal affect.     ASSESSMENT AND PLAN:  36 y.o. year old male with early sinusitis and uncontrolled diabetes mellitus. 1. Sinusitis -Azithromycin 250 MG #6 2 po first day then 1 po next 4 days no RF -Atrovent NS 0.06% 2 sprays each nare bid prn #1 no RF -Hycodan #4oz 1 tsp po q 4-6 hours prn cough no RF SED -Mucinex -Tylenol/Motrin prn -Rest/fluids -RTC precautions -RTC 3-5 days if no improvement  2. Diabetes mellitus -Uncontrolled -Follow up with Dr. Lucianne Muss as directed  Signed, Eula Listen, PA-C 08/15/2011 3:26 PM

## 2011-08-17 ENCOUNTER — Encounter: Payer: Self-pay | Admitting: Physician Assistant

## 2011-09-06 ENCOUNTER — Ambulatory Visit (INDEPENDENT_AMBULATORY_CARE_PROVIDER_SITE_OTHER): Payer: BC Managed Care – PPO | Admitting: Family Medicine

## 2011-09-06 ENCOUNTER — Ambulatory Visit: Payer: BC Managed Care – PPO

## 2011-09-06 VITALS — BP 131/85 | HR 90 | Temp 97.9°F | Resp 18

## 2011-09-06 DIAGNOSIS — R109 Unspecified abdominal pain: Secondary | ICD-10-CM

## 2011-09-06 DIAGNOSIS — Q828 Other specified congenital malformations of skin: Secondary | ICD-10-CM

## 2011-09-06 DIAGNOSIS — N529 Male erectile dysfunction, unspecified: Secondary | ICD-10-CM

## 2011-09-06 DIAGNOSIS — K644 Residual hemorrhoidal skin tags: Secondary | ICD-10-CM

## 2011-09-06 LAB — POCT UA - MICROSCOPIC ONLY
Mucus, UA: NEGATIVE
Yeast, UA: NEGATIVE

## 2011-09-06 LAB — POCT URINALYSIS DIPSTICK
Bilirubin, UA: NEGATIVE
Ketones, UA: NEGATIVE
Leukocytes, UA: NEGATIVE
Protein, UA: 100
Spec Grav, UA: 1.02
pH, UA: 7

## 2011-09-06 LAB — POCT CBC
Granulocyte percent: 60.2 %G (ref 37–80)
HCT, POC: 38.7 % — AB (ref 43.5–53.7)
MCH, POC: 25.7 pg — AB (ref 27–31.2)
MCV: 79.4 fL — AB (ref 80–97)
MID (cbc): 0.5 (ref 0–0.9)
POC LYMPH PERCENT: 33.4 %L (ref 10–50)
Platelet Count, POC: 396 10*3/uL (ref 142–424)
RDW, POC: 15.1 %
WBC: 8.5 10*3/uL (ref 4.6–10.2)

## 2011-09-06 MED ORDER — TADALAFIL 20 MG PO TABS: 10.0000 mg | ORAL_TABLET | ORAL | Status: DC | PRN

## 2011-09-06 MED ORDER — CYCLOBENZAPRINE HCL 10 MG PO TABS: 10.0000 mg | ORAL_TABLET | Freq: Two times a day (BID) | ORAL | Status: AC | PRN

## 2011-09-06 NOTE — Progress Notes (Signed)
Patient Name: Eric Farley Date of Birth: [birth year] Medical Record Number: [id number] Gender: male Date of Encounter: 09/06/2011  History of Present Illness:  Eric Farley is a 36 y.o. very pleasant male patient who presents with the following:  Today is Wednesday.  On Monday he noted bilateral upper abdominal pain.  This has moved over to be just in his left side.  It feels "sharp," it comes and goes.  Leaning forward makes it feel better at least for a while.  No nausea, vomiting or diarrhea- eating ok.  He has not noted any fever.  No blood in urine, no pain with urination.  He feels perhaps a little constipated lately.  He does have a history of a large left kidney stone which has been treated with lithotripsy but not successfully.    He actually has no abdominal pain at this time.  He also has a number of skin tags around his neckline which he would like removed if possible.   Patient Active Problem List  Diagnoses  . DM (diabetes mellitus), type 2, uncontrolled  . Sleep apnea  . HTN (hypertension)  . Hypercholesterolemia   Past Medical History  Diagnosis Date  . Sleep apnea     does not like CPAP  . HTN (hypertension)   . Hypercholesterolemia   . Diabetes mellitus    Past Surgical History  Procedure Date  . Ankle fracture surgery 2007  . Lithotripsy    History  Substance Use Topics  . Smoking status: Never Smoker   . Smokeless tobacco: Not on file  . Alcohol Use: No   No family history on file. No Known Allergies  Medication list has been reviewed and updated.  Review of Systems: As per HPI- otherwise negative. Also has noted some skin tags around his neckline for the last year or so  Physical Examination: Filed Vitals:   09/06/11 1016  BP: 131/85  Pulse: 90  Temp: 97.9 F (36.6 C)  TempSrc: Oral  Resp: 18    There is no height or weight on file to calculate BMI.  GEN: WDWN, NAD, Non-toxic, A & O x 3, overweight, looks well HEENT:  Atraumatic, Normocephalic. Neck supple. No masses, No LAD.  Tm, oropharynx wnl Ears and Nose: No external deformity. CV: RRR, No M/G/R. No JVD. No thrill. No extra heart sounds. PULM: CTA B, no wheezes, crackles, rhonchi. No retractions. No resp. distress. No accessory muscle use. ABD: S, NT, ND, +BS. No rebound. No HSM. The pain is not abdominal- it is in his left lateral, inferior ribs.  No redness, swelling or lesions.  No CVA tenderness EXTR: No c/c/e NEURO Normal gait.  PSYCH: Normally interactive. Conversant. Not depressed or anxious appearing.  Calm demeanor.  Multiple skin tags around neck  Results for orders placed in visit on 09/06/11  POCT CBC      Component Value Range   WBC 8.5  4.6 - 10.2 (K/uL)   Lymph, poc 2.8  0.6 - 3.4    POC LYMPH PERCENT 33.4  10 - 50 (%L)   MID (cbc) 0.5  0 - 0.9    POC MID % 6.4  0 - 12 (%M)   POC Granulocyte 5.1  2 - 6.9    Granulocyte percent 60.2  37 - 80 (%G)   RBC 4.87  4.69 - 6.13 (M/uL)   Hemoglobin 12.5 (*) 14.1 - 18.1 (g/dL)   HCT, POC 40.9 (*) 81.1 - 53.7 (%)   MCV 79.4 (*)  80 - 97 (fL)   MCH, POC 25.7 (*) 27 - 31.2 (pg)   MCHC 32.3  31.8 - 35.4 (g/dL)   RDW, POC 16.1     Platelet Count, POC 396  142 - 424 (K/uL)   MPV 9.6  0 - 99.8 (fL)  GLUCOSE, POCT (MANUAL RESULT ENTRY)      Component Value Range   POC Glucose 96    POCT URINALYSIS DIPSTICK      Component Value Range   Color, UA yellow     Clarity, UA clear     Glucose, UA neg     Bilirubin, UA neg     Ketones, UA neg     Spec Grav, UA 1.020     Blood, UA trace     pH, UA 7.0     Protein, UA 100     Urobilinogen, UA 1.0     Nitrite, UA neg     Leukocytes, UA Negative    POCT UA - MICROSCOPIC ONLY      Component Value Range   WBC, Ur, HPF, POC 2-4     RBC, urine, microscopic 1-3     Bacteria, U Microscopic trace     Mucus, UA neg     Epithelial cells, urine per micros 1-3     Crystals, Ur, HPF, POC neg     Casts, Ur, LPF, POC neg     Yeast, UA neg     [facility]  reading (PRIMARY) by  Dr. Patsy Lager.  Negative chest and abdomen except likely kidney stone on left  Procecdure: multiple small skin tags around neckline.  Prepped carefully with betadine, then wiped with alcohol.  Larger tags were anesthetized at base with lidocaine- removed #14 skin tags in total, used dry-sol for hemostasis and dressed with bandaids Assessment and Plan: 1. Side pain  POCT CBC, POCT glucose (manual entry), POCT urinalysis dipstick, POCT UA - Microscopic Only, DG Chest 2 View, DG Abd 1 View, cyclobenzaprine (FLEXERIL) 10 MG tablet  2. Erectile dysfunction  tadalafil (CIALIS) 20 MG tablet  3. Accessory skin tags     Refilled cialis per his request- he reports no CP or other problems with this medication.  Pain seems to be MSK and located in lower ribs/ side area- is it not actually abdominal pain.  Will treat with flexeril as needed- he can also try just tylenol if he prefers.   Large kidney stone seems to be stable from old films- await OR report, may need to follow- up UA due to trace hematuria   Slight anemia- he agreed to RTC for a recheck of this and also AIc in about 3 weeks.

## 2011-09-07 ENCOUNTER — Other Ambulatory Visit: Payer: Self-pay | Admitting: Family Medicine

## 2011-09-07 NOTE — Telephone Encounter (Signed)
PT STATES DR Patsy Lager TOLD HIM IF IT WAS A DRASTIC CHANGE IN HIM, TO GIVE [place] A CALL. WAS SEEN FOR RIB AND ABDOMINAL PAIN AND THE PAIN IS STILL THERE IN THE ABD ALSO HE IS SWEATY. PLEASE CALL [phone]  [facility] [place] ON [address]

## 2011-11-06 ENCOUNTER — Other Ambulatory Visit: Payer: Self-pay | Admitting: Physician Assistant

## 2011-12-23 ENCOUNTER — Other Ambulatory Visit: Payer: Self-pay | Admitting: Physician Assistant

## 2012-04-22 ENCOUNTER — Other Ambulatory Visit: Payer: Self-pay | Admitting: Family Medicine

## 2012-04-22 ENCOUNTER — Other Ambulatory Visit: Payer: Self-pay | Admitting: Physician Assistant

## 2012-05-13 ENCOUNTER — Other Ambulatory Visit: Payer: Self-pay | Admitting: Physician Assistant

## 2012-05-14 NOTE — Telephone Encounter (Signed)
Needs office visit - 3rd notice

## 2012-05-21 ENCOUNTER — Other Ambulatory Visit: Payer: Self-pay | Admitting: Physician Assistant

## 2012-06-10 ENCOUNTER — Other Ambulatory Visit: Payer: Self-pay | Admitting: Physician Assistant

## 2012-06-25 ENCOUNTER — Other Ambulatory Visit: Payer: Self-pay | Admitting: Physician Assistant

## 2012-07-03 ENCOUNTER — Other Ambulatory Visit: Payer: Self-pay | Admitting: Physician Assistant

## 2012-07-17 ENCOUNTER — Ambulatory Visit: Payer: BC Managed Care – PPO | Admitting: Emergency Medicine

## 2012-07-17 VITALS — BP 164/118 | HR 84 | Temp 98.3°F | Resp 16 | Ht 69.0 in | Wt 269.4 lb

## 2012-07-17 DIAGNOSIS — I10 Essential (primary) hypertension: Secondary | ICD-10-CM

## 2012-07-17 DIAGNOSIS — E119 Type 2 diabetes mellitus without complications: Secondary | ICD-10-CM

## 2012-07-17 DIAGNOSIS — E7800 Pure hypercholesterolemia, unspecified: Secondary | ICD-10-CM

## 2012-07-17 DIAGNOSIS — J018 Other acute sinusitis: Secondary | ICD-10-CM

## 2012-07-17 MED ORDER — OMEPRAZOLE 20 MG PO CPDR: 40.0000 mg | DELAYED_RELEASE_CAPSULE | Freq: Every day | ORAL | Status: DC

## 2012-07-17 MED ORDER — ROSUVASTATIN CALCIUM 20 MG PO TABS: 20.0000 mg | ORAL_TABLET | Freq: Every day | ORAL | Status: DC

## 2012-07-17 MED ORDER — AMOXICILLIN-POT CLAVULANATE 875-125 MG PO TABS: 1.0000 | ORAL_TABLET | Freq: Two times a day (BID) | ORAL | Status: DC

## 2012-07-17 MED ORDER — METFORMIN HCL 1000 MG PO TABS: 1000.0000 mg | ORAL_TABLET | Freq: Two times a day (BID) | ORAL | Status: DC

## 2012-07-17 MED ORDER — LISINOPRIL-HYDROCHLOROTHIAZIDE 10-12.5 MG PO TABS: 1.0000 | ORAL_TABLET | Freq: Every day | ORAL | Status: DC

## 2012-07-17 NOTE — Progress Notes (Signed)
[facility] [address], [place] [address] [phone]  Date:  07/17/2012   Name:  Eric Farley   DOB:  09/09/1975   MRN:  [id number]  PCP:  Virgilio Belling    Chief Complaint: Cough, Nasal Congestion and Medication Refill   History of Present Illness:  Eric Farley is a 37 y.o. very pleasant male patient who presents with the following:  Ill with a nasal drainage and nasal congestion for past week. Has sore throat.  Cough that is largely not productive.  No wheezing or shortness of breath.  No fever or chills.  No nausea or vomiting.  No stool change or rash.  Out of his usual medications for two weeks.    Patient Active Problem List  Diagnosis  . DM (diabetes mellitus), type 2, uncontrolled  . Sleep apnea  . HTN (hypertension)  . Hypercholesterolemia    Past Medical History  Diagnosis Date  . Sleep apnea     does not like CPAP  . HTN (hypertension)   . Hypercholesterolemia   . Diabetes mellitus     Past Surgical History  Procedure Date  . Ankle fracture surgery 2007  . Lithotripsy   . Fracture surgery     History  Substance Use Topics  . Smoking status: Never Smoker   . Smokeless tobacco: Not on file  . Alcohol Use: No    Family History  Problem Relation Age of Onset  . Hypertension Mother   . Arthritis Mother   . Hypertension Father   . Diabetes Father     No Known Allergies  Medication list has been reviewed and updated.  Current Outpatient Prescriptions on File Prior to Visit  Medication Sig Dispense Refill  . lisinopril-hydrochlorothiazide (PRINZIDE,ZESTORETIC) 10-12.5 MG per tablet TAKE ONE TABLET BY MOUTH EVERY DAY  15 tablet  0  . metFORMIN (GLUCOPHAGE) 1000 MG tablet TAKE ONE TABLET BY MOUTH TWICE DAILY WITH FOOD NEEDS OFFICE VISIT/LABS  30 tablet  0  . omeprazole (PRILOSEC) 20 MG capsule TAKE ONE CAPSULE BY MOUTH EVERY DAY  15 capsule  0  . insulin glargine (LANTUS SOLOSTAR) 100 UNIT/ML injection Pt  to start at 15U qhs and increase by 2 units every 2 nights until am glucose <140  5 pen  0  . insulin glulisine (APIDRA) 100 UNIT/ML injection Inject 30 Units into the skin 3 (three) times daily before meals.      . Insulin Pen Needle 31G X 8 MM MISC 1 each by Does not apply route daily.  100 each  0  . ipratropium (ATROVENT) 0.06 % nasal spray Place 2 sprays into the nose 3 (three) times daily.  15 mL  0  . simvastatin (ZOCOR) 40 MG tablet Take 1 tablet (40 mg total) by mouth every evening.  30 tablet  2    Review of Systems:  As per HPI, otherwise negative.    Physical Examination: Filed Vitals:   07/17/12 1557  BP: 164/118  Pulse: 84  Temp: 98.3 F (36.8 C)  Resp: 16   Filed Vitals:   07/17/12 1557  Height: 5\' 9"  (1.753 m)  Weight: 269 lb 6.4 oz (122.199 kg)   Body mass index is 39.78 kg/(m^2). Ideal Body Weight: Weight in (lb) to have BMI = 25: 168.9   GEN: obese, NAD, Non-toxic, A & O x 3 HEENT: Atraumatic, Normocephalic. Neck supple. No masses, No LAD. Ears and Nose: No external deformity.  Green nasal drainage CV:  RRR, No M/G/R. No JVD. No thrill. No extra heart sounds. PULM: CTA B, no wheezes, crackles, rhonchi. No retractions. No resp. distress. No accessory muscle use. ABD: S, NT, ND, +BS. No rebound. No HSM. EXTR: No c/c/e NEURO Normal gait.  PSYCH: Normally interactive. Conversant. Not depressed or anxious appearing.  Calm demeanor.    Assessment and Plan: Sinusitis augmentin Hypertension NIDDm Non compliance  Carmelina Dane, MD

## 2012-07-17 NOTE — Patient Instructions (Addendum)

## 2012-10-16 ENCOUNTER — Other Ambulatory Visit: Payer: Self-pay | Admitting: Emergency Medicine

## 2012-10-16 ENCOUNTER — Other Ambulatory Visit: Payer: Self-pay | Admitting: Physician Assistant

## 2013-01-30 ENCOUNTER — Ambulatory Visit (INDEPENDENT_AMBULATORY_CARE_PROVIDER_SITE_OTHER): Payer: BC Managed Care – PPO | Admitting: Family Medicine

## 2013-01-30 ENCOUNTER — Encounter: Payer: Self-pay | Admitting: Physician Assistant

## 2013-01-30 ENCOUNTER — Telehealth: Payer: Self-pay

## 2013-01-30 VITALS — BP 127/90 | HR 90 | Temp 98.3°F | Resp 16 | Ht 70.0 in | Wt 220.0 lb

## 2013-01-30 DIAGNOSIS — N529 Male erectile dysfunction, unspecified: Secondary | ICD-10-CM

## 2013-01-30 DIAGNOSIS — E1165 Type 2 diabetes mellitus with hyperglycemia: Secondary | ICD-10-CM

## 2013-01-30 DIAGNOSIS — E782 Mixed hyperlipidemia: Secondary | ICD-10-CM

## 2013-01-30 DIAGNOSIS — K219 Gastro-esophageal reflux disease without esophagitis: Secondary | ICD-10-CM

## 2013-01-30 DIAGNOSIS — Z23 Encounter for immunization: Secondary | ICD-10-CM

## 2013-01-30 DIAGNOSIS — E7800 Pure hypercholesterolemia, unspecified: Secondary | ICD-10-CM

## 2013-01-30 DIAGNOSIS — I10 Essential (primary) hypertension: Secondary | ICD-10-CM

## 2013-01-30 LAB — POCT GLYCOSYLATED HEMOGLOBIN (HGB A1C): Hemoglobin A1C: 14

## 2013-01-30 LAB — GLUCOSE, POCT (MANUAL RESULT ENTRY)

## 2013-01-30 MED ORDER — OMEPRAZOLE 20 MG PO CPDR: 40.0000 mg | DELAYED_RELEASE_CAPSULE | Freq: Every day | ORAL | Status: AC

## 2013-01-30 MED ORDER — LISINOPRIL-HYDROCHLOROTHIAZIDE 10-12.5 MG PO TABS: 1.0000 | ORAL_TABLET | Freq: Every day | ORAL | Status: DC

## 2013-01-30 MED ORDER — METFORMIN HCL 1000 MG PO TABS: 1000.0000 mg | ORAL_TABLET | Freq: Two times a day (BID) | ORAL | Status: DC

## 2013-01-30 MED ORDER — TADALAFIL 20 MG PO TABS: 10.0000 mg | ORAL_TABLET | ORAL | Status: DC | PRN

## 2013-01-30 MED ORDER — INSULIN PEN NEEDLE 31G X 8 MM MISC: 1.0000 | Freq: Every day | Status: AC

## 2013-01-30 MED ORDER — INSULIN GLARGINE 100 UNIT/ML ~~LOC~~ SOLN: SUBCUTANEOUS | Status: DC

## 2013-01-30 NOTE — Patient Instructions (Addendum)
Check your blood sugar twice daily-fasting and 2-3 hours after the largest meal of the day, and record the results. If you develop nausea/vomiting, abdominal pain, dizziness, chest pain or shortness of breath, go the the emergency department.

## 2013-01-30 NOTE — Telephone Encounter (Signed)
Saw Chelle today, was told to call if there was an issue with the prescription and there is. Needs to start the medication tonight. Please call back at [phone].

## 2013-01-30 NOTE — Telephone Encounter (Signed)
Pt says pharm told him that there is no generic for Lantus and Lantus is too expensive. Also Cialis is too expensive. Can we change to something else. Thanks

## 2013-01-30 NOTE — Progress Notes (Signed)
Subjective:    Patient ID: Eric Farley, male    DOB: 09/03/1975, 37 y.o.   MRN: [id number]  HPI This 37 y.o. male presents for evaluation of DM type 2, HTN, GERD and ED. He needs refills of his medications. He leaves for [place] and [place] next week.   Additionally, he requests a new handicap placard for his car-his ambulation is limited by pain in the RIGHT foot/ankle.  This is longstanding and exacerbated by long standing/walking.  He reports is is not always able to walk 200 ft. Without stopping to rest.  His last visit here was 6 months ago for an acute illness, and no labs were drawn.  Last A1C herewas 07/2011, and was 14.5%. He was referred to endocrinology and was on insulin until about a year ago, when he D/C'd it "because Dr. Lucianne Muss said I didn't need it anymore." He reports that his glucose was under control (fasting and 6-hour post-prandial readings 140's-180) until 4-5 weeks ago, when it got higher.  *initially I understood that the patient stopped the insulin due to cost.  He was on Lantus and Apidra.  However, when I clarified this during discussion of alternative treatments to manage his diabetes, he reports that the insulin was D/C'd due to control.  Frequency of home glucose monitoring: not regularly, tries to do it twice weekly, has been "high" lately. Sees a dentist irregularly, eye specialist annually. Checks feet daily. Is current with influenza vaccine. Is not current with pneumococcal vaccine.   Past Medical History  Diagnosis Date  . Sleep apnea     does not like CPAP  . HTN (hypertension)   . Hypercholesterolemia   . Diabetes mellitus     Past Surgical History  Procedure Laterality Date  . Ankle fracture surgery  2007  . Lithotripsy    . Fracture surgery      Prior to Admission medications   Medication Sig Start Date End Date Taking? Authorizing Provider  lisinopril-hydrochlorothiazide (PRINZIDE,ZESTORETIC) 10-12.5 MG per tablet Take 1 tablet by  mouth daily. 01/30/13  Yes Dartanion Teo S Yuleni Burich, PA-C  metFORMIN (GLUCOPHAGE) 1000 MG tablet Take 1 tablet (1,000 mg total) by mouth 2 (two) times daily with a meal. 01/30/13  Yes Kailia Starry S Jack Bolio, PA-C  omeprazole (PRILOSEC) 20 MG capsule Take 2 capsules (40 mg total) by mouth daily. 01/30/13  Yes Diaz Crago S Deaja Rizo, PA-C  insulin glargine (LANTUS) 100 UNIT/ML injection Pt to start at 15U qhs and increase by 2 units every 2 nights until am glucose <140 01/30/13   Cherrelle Plante S Aleaha Fickling, PA-C  Insulin Pen Needle 31G X 8 MM MISC 1 each by Does not apply route daily. 01/30/13   Zayonna Ayuso S Railey Glad, PA-C  tadalafil (CIALIS) 20 MG tablet Take 0.5-1 tablets (10-20 mg total) by mouth every other day as needed for erectile dysfunction. 01/30/13 03/01/13  Ikran Patman Tessa Lerner, PA-C    No Known Allergies  History   Social History  . Marital Status: Divorced    Spouse Name: n/a    Number of Children: 1  . Years of Education: Master's   Occupational History  . Garment/textile technologist   . Consultant/Auditor     [employer]   Social History Main Topics  . Smoking status: Never Smoker   . Smokeless tobacco: Never Used  . Alcohol Use: 1.5 - 2 oz/week    3-4 drink(s) per week     Comment: wine cooler  . Drug Use: No  . Sexual Activity: Yes  Other Topics Concern  . Not on file   Social History Narrative   Divorced. Lives alone. His daughter lives with her mother (not his ex-wife). Working on his PhD in [employer]. Originally from [place], parents still live there, has lived in the [place] since 1994.   Walks the dog 30 min x 2 each day.    Family History  Problem Relation Age of Onset  . Hypertension Mother   . Arthritis Mother   . Hypertension Father   . Diabetes Father     Review of Systems Some increased thirst and urinary frequency. Denies chest pain, shortness of breath, HA, dizziness, vision change, nausea, vomiting, diarrhea, constipation, melena, hematochezia, dysuria, increased urinary urgency, increased hunger,  unintentional weight change, unexplained myalgias or arthralgias, rash.     Objective:   Physical Exam  Constitutional: He is oriented to person, place, and time. Vital signs are normal. He appears well-developed and well-nourished. He is active and cooperative. No distress.  HENT:  Head: Normocephalic and atraumatic.  Right Ear: Hearing normal.  Left Ear: Hearing normal.  Eyes: EOM are normal. Pupils are equal, round, and reactive to light.  Neck: Normal range of motion. Neck supple. No thyromegaly present.  Cardiovascular: Normal rate, regular rhythm and normal heart sounds.   Pulses:      Radial pulses are 2+ on the right side, and 2+ on the left side.       Dorsalis pedis pulses are 2+ on the right side, and 2+ on the left side.       Posterior tibial pulses are 2+ on the right side, and 2+ on the left side.  Pulmonary/Chest: Effort normal and breath sounds normal.  Lymphadenopathy:       Head (right side): No tonsillar, no preauricular, no posterior auricular and no occipital adenopathy present.       Head (left side): No tonsillar, no preauricular, no posterior auricular and no occipital adenopathy present.    He has no cervical adenopathy.       Right: No supraclavicular adenopathy present.       Left: No supraclavicular adenopathy present.  Neurological: He is alert and oriented to person, place, and time. No sensory deficit.  See DM foot exam.  Skin: Skin is warm, dry and intact. No rash noted. No cyanosis or erythema. Nails show no clubbing.  Psychiatric: He has a normal mood and affect.      Results for orders placed in visit on 01/30/13  GLUCOSE, POCT (MANUAL RESULT ENTRY)      Result Value Range   POC Glucose hhh  70 - 99 mg/dl  POCT GLYCOSYLATED HEMOGLOBIN (HGB A1C)      Result Value Range   Hemoglobin A1C 14.0         Assessment & Plan:  DM (diabetes mellitus), type 2, uncontrolled - Plan: POCT glucose (manual entry), POCT glycosylated hemoglobin (Hb A1C),  Comprehensive metabolic panel, Microalbumin, urine, metFORMIN (GLUCOPHAGE) 1000 MG tablet, RESTART insulin glargine (LANTUS) 100 UNIT/ML injection, Insulin Pen Needle 31G X 8 MM MISC. Increase physical activity (he's considering a stationary bike) and water intake.  May need short-acting insulin as well.  Re-evaluate with Dr. Neva Seat on 02/01/2013.  He is to return here or go to the ED should he develop nausea/vomiting, abdominal pain, dizziness, CP, SOB.  HTN (hypertension) - Plan: CBC with Differential, TSH, lisinopril-hydrochlorothiazide (PRINZIDE,ZESTORETIC) 10-12.5 MG per tablet  Hypercholesterolemia - Plan: Lipid panel; suspect he'll need to restart a statin (previously on Crestor).  Erectile dysfunction - Plan: tadalafil (CIALIS) 20 MG tablet  Need for pneumococcal vaccination - Plan: Pneumococcal polysaccharide vaccine 23-valent greater than or equal to 2yo subcutaneous/IM; repeat at age 68 years  GERD (gastroesophageal reflux disease) - Plan: omeprazole (PRILOSEC) 20 MG capsule  Discussed with Dr. Neva Seat.  Fernande Bras, PA-C Physician Assistant-Certified [facility]

## 2013-01-30 NOTE — Progress Notes (Signed)
Patient discussed with Porfirio Oar, PA-C. Agree with assessment and plan of care per her note. At his level of A1C, suspect he will need to remain on basal insulin at the least, but will recheck in 2 days and discuss plan further.

## 2013-01-30 NOTE — Telephone Encounter (Signed)
Please call this patient. How much was the Lantus? I'm surprised it was cost prohibitive with his insurance. The electronic information I have indicates that Lantus has preferred status with his prescription benefit plan.  We can change him to a 70/30 mix, but we'll need to talk with the pharmacy to find out what is less expensive for him.  Since I was not working this evening, I did not get this message until after the office was closed.  It would have been more appropriate to ask another PA to help in my absence, since this patient really needed to start this medication tonight. Please ask another PA to help, as I am out of the office Friday-Sunday.

## 2013-01-31 LAB — LIPID PANEL: Total CHOL/HDL Ratio: 7.7 Ratio

## 2013-01-31 LAB — COMPREHENSIVE METABOLIC PANEL
ALT: 30 U/L (ref 0–53)
AST: 14 U/L (ref 0–37)
Alkaline Phosphatase: 40 U/L (ref 39–117)
CO2: 23 mEq/L (ref 19–32)
Creat: 1.1 mg/dL (ref 0.50–1.35)
Sodium: 128 mEq/L — ABNORMAL LOW (ref 135–145)
Total Bilirubin: 0.8 mg/dL (ref 0.3–1.2)
Total Protein: 8.4 g/dL — ABNORMAL HIGH (ref 6.0–8.3)

## 2013-01-31 LAB — CBC WITH DIFFERENTIAL/PLATELET
Basophils Absolute: 0 10*3/uL (ref 0.0–0.1)
Eosinophils Absolute: 0.1 10*3/uL (ref 0.0–0.7)
Eosinophils Relative: 1 % (ref 0–5)
HCT: 48.5 % (ref 39.0–52.0)
Lymphocytes Relative: 35 % (ref 12–46)
MCH: 26.4 pg (ref 26.0–34.0)
MCHC: 34 g/dL (ref 30.0–36.0)
MCV: 77.6 fL — ABNORMAL LOW (ref 78.0–100.0)
Monocytes Absolute: 0.6 10*3/uL (ref 0.1–1.0)
Platelets: 374 10*3/uL (ref 150–400)
RDW: 16.2 % — ABNORMAL HIGH (ref 11.5–15.5)

## 2013-01-31 LAB — MICROALBUMIN, URINE: Microalb, Ur: 13.38 mg/dL — ABNORMAL HIGH (ref 0.00–1.89)

## 2013-01-31 LAB — TSH: TSH: 3.562 u[IU]/mL (ref 0.350–4.500)

## 2013-01-31 MED ORDER — VARDENAFIL HCL 10 MG PO TABS: 10.0000 mg | ORAL_TABLET | Freq: Every day | ORAL | Status: AC | PRN

## 2013-01-31 NOTE — Telephone Encounter (Signed)
Spoke with patient and [facility] pharmacy ([address]).  The Lantus is $227 for patient.  Pharmacist was not very helpful but did inform me that Novolin 70/30 would be $127 for patient which according to patient is still too expensive.  Patient will call insurance to find out if this is due to a high deductible and what a less expensive alternative is for him.  In the meantime, after discussing with Frances Furbish, patient will come in for [place] to give him a sample of insulin.  This is just to hold him over until we figure out a permanent alternative/plan...which I hope to figure out within the day.  (Patient's glucose reading was 347 this morning).

## 2013-01-31 NOTE — Telephone Encounter (Signed)
It sounds like pt has an unmet deductible.  We have levemir samples here.  Pt can come in to pick up sample pen.  Start TODAY, 15 units.  Follow up with Dr. Neva Seat tomorrow morning as planned.  70/30 may be the least expensive option for patient at this point.

## 2013-01-31 NOTE — Telephone Encounter (Signed)
Please call patient and/or pharmacy and find out why Lantus is so expensive - insulin glargine is the generic form of Lantus, should be inexpensive with insurance.  If that is too costly, please find out from pharmacy what would be less expensive.  Pt needs insulin TODAY!  DM is uncontrolled and pt is going out of the country this week

## 2013-01-31 NOTE — Telephone Encounter (Signed)
Thank you Magda Paganini!  Will send Levitra to [facility]

## 2013-01-31 NOTE — Telephone Encounter (Signed)
Patient came in.  Gave him the Levemir sample as Lanora Manis instructed.  He administered 15 units in office at 1025am  FYI He called [facility] and he found the pharmacist there to be more helpful.  The pharmacist is presently doing research to find him a cheaper alternative.  Please send all future Rx's to [facility].   In addition, the cialis is also expensive but the pharmacist at [facility] informed him that the Levitra is cheaper.  Could we send in a Rx for this?  Patient will follow up with Dr Neva Seat tomorrow as planned and will let us know an update on the insulin situation.

## 2013-02-01 ENCOUNTER — Ambulatory Visit: Payer: BC Managed Care – PPO | Admitting: Family Medicine

## 2013-02-01 VITALS — BP 120/84 | HR 93 | Temp 98.1°F | Resp 18 | Ht 70.0 in | Wt 222.0 lb

## 2013-02-01 DIAGNOSIS — E119 Type 2 diabetes mellitus without complications: Secondary | ICD-10-CM

## 2013-02-01 MED ORDER — INSULIN GLARGINE 100 UNIT/ML ~~LOC~~ SOLN: SUBCUTANEOUS | Status: DC

## 2013-02-01 MED ORDER — INSULIN NPH ISOPHANE & REGULAR (70-30) 100 UNIT/ML ~~LOC~~ SUSP: SUBCUTANEOUS | Status: DC

## 2013-02-01 NOTE — Patient Instructions (Addendum)
Start 70/30 insulin - 10 units with meals twice per day to start, then increase by 2 units per dose every 2 days until blood sugars below 200. Once below 200 - remain at those doses until follow up in next 3 weeks. Watch for low blood sugar symptoms as below. Return to the clinic or go to the nearest emergency room if any of your symptoms worsen or new symptoms occur. If any questions before you leave out of town - please call.   Hypoglycemia (Low Blood Sugar) Hypoglycemia is when the glucose (sugar) in your blood is too low. Hypoglycemia can happen for many reasons. It can happen to people with or without diabetes. Hypoglycemia can develop quickly and can be a medical emergency.  CAUSES  Having hypoglycemia does not mean that you will develop diabetes. Different causes include:  Missed or delayed meals or not enough carbohydrates eaten.  Medication overdose. This could be by accident or deliberate. If by accident, your medication may need to be adjusted or changed.  Exercise or increased activity without adjustments in carbohydrates or medications.  A nerve disorder that affects body functions like your heart rate, blood pressure and digestion (autonomic neuropathy).  A condition where the stomach muscles do not function properly (gastroparesis). Therefore, medications may not absorb properly.  The inability to recognize the signs of hypoglycemia (hypoglycemic unawareness).  Absorption of insulin  may be altered.  Alcohol consumption.  Pregnancy/menstrual cycles/postpartum. This may be due to hormones.  Certain kinds of tumors. This is very rare. SYMPTOMS   Sweating.  Hunger.  Dizziness.  Blurred vision.  Drowsiness.  Weakness.  Headache.  Rapid heart beat.  Shakiness.  Nervousness. DIAGNOSIS  Diagnosis is made by monitoring blood glucose in one or all of the following ways:  Fingerstick blood glucose monitoring.  Laboratory results. TREATMENT  If you think your  blood glucose is low:  Check your blood glucose, if possible. If it is less than 70 mg/dl, take one of the following:  3-4 glucose tablets.   cup juice (prefer clear like apple).   cup "regular" soda pop.  1 cup milk.  -1 tube of glucose gel.  5-6 hard candies.  Do not over treat because your blood glucose (sugar) will only go too high.  Wait 15 minutes and recheck your blood glucose. If it is still less than 70 mg/dl (or below your target range), repeat treatment.  Eat a snack if it is more than one hour until your next meal. Sometimes, your blood glucose may go so low that you are unable to treat yourself. You may need someone to help you. You may even pass out or be unable to swallow. This may require you to get an injection of glucagon, which raises the blood glucose. HOME CARE INSTRUCTIONS  Check blood glucose as recommended by your caregiver.  Take medication as prescribed by your caregiver.  Follow your meal plan. Do not skip meals. Eat on time.  If you are going to drink alcohol, drink it only with meals.  Check your blood glucose before driving.  Check your blood glucose before and after exercise. If you exercise longer or different than usual, be sure to check blood glucose more frequently.  Always carry treatment with you. Glucose tablets are the easiest to carry.  Always wear medical alert jewelry or carry some form of identification that states that you have diabetes. This will alert people that you have diabetes. If you have hypoglycemia, they will have a better  idea on what to do. SEEK MEDICAL CARE IF:   You are having problems keeping your blood sugar at target range.  You are having frequent episodes of hypoglycemia.  You feel you might be having side effects from your medicines.  You have symptoms of an illness that is not improving after 3-4 days.  You notice a change in vision or a new problem with your vision. SEEK IMMEDIATE MEDICAL CARE IF:    You are a family member or friend of a person whose blood glucose goes below 70 mg/dl and is accompanied by:  Confusion.  A change in mental status.  The inability to swallow.  Passing out. Document Released: 05/29/2005 Document Revised: 08/21/2011 Document Reviewed: 09/25/2011 [facility] Patient Information 2014 [place], [place].

## 2013-02-01 NOTE — Progress Notes (Addendum)
Subjective:    Patient ID: Eric Farley, male    DOB: 30-Dec-1975, 37 y.o.   MRN: [id number]  HPI Eric Farley is a 37 y.o. male  Seen by Porfirio Oar, PA-C 2 days ago - see her note.  Hx of DM2, uncontrolled with prior A1c in 07/2011 of 14.3. Apparently had been under care of endocrinologist at some point and had been on insulin, then discontinued due to improved control of DM2. Had been up to 45 units of basal insulin, but as blood sugars had improved, could stop insulin.  Has lost weight intentionally since Feb of this year. 270 to 222.   Restarted on lantus last ov, and continued metformin, but per phone notes, unable to get Lantus due to cost.  Levemir 15 untis given here yesterday am. Sent home with Levemir pen. 300 units in each pain.  Traveling to [place] in 3 days, and will be in [place] for 2 weeks at least, maybe longer as found out relative passed away this am. No n/v.  CMP reviewed from last ov - Na 128, but CBG 430.  Home CBG's: 347, 343 yesterday. 322 this am. Feels better since taking levemir. Less weak, no abd pain, no n/v. No blurry vision. Talked to other pharmacist as per phone note - Lantus would be about $70. This is possible to fill.     Review of Systems  Constitutional: Negative for fever and chills.  Gastrointestinal: Negative for nausea, vomiting and abdominal pain.  Skin: Negative for rash.       Objective:   Physical Exam  Vitals reviewed. Constitutional: He is oriented to person, place, and time. He appears well-developed and well-nourished.  HENT:  Head: Normocephalic and atraumatic.  Eyes: EOM are normal. Pupils are equal, round, and reactive to light.  Neck: No JVD present. Carotid bruit is not present.  Cardiovascular: Normal rate, regular rhythm and normal heart sounds.   No murmur heard. Pulmonary/Chest: Effort normal and breath sounds normal. He has no rales.  Abdominal: There is no tenderness.  Musculoskeletal: He exhibits no edema.   Neurological: He is alert and oriented to person, place, and time.  Skin: Skin is warm and dry.  Psychiatric: He has a normal mood and affect.      Results for orders placed in visit on 02/01/13  GLUCOSE, POCT (MANUAL RESULT ENTRY)      Result Value Range   POC Glucose 302 (*) 70 - 99 mg/dl       Assessment & Plan:  [name] is a 37 y.o. male Diabetes - Plan: POCT glucose (manual entry)  Uncontrolled off insulin, started on Levemir past 2 days with slight improvement into 300's.  Initial plan of lantus as reportedly $70 per insurance plan, but received call from pharmacy at 12:00 - lantus would be in 200 range, they have a coupon for Levemir - ok to change to this - will start Levemir 20 units tomorrow (instead of Lantus as in patient instructions) and increase by 2 units every 2-3 days until readings below 200's. Plan to follow up in next 2-3 weeks when he returns from [place].  Hypoglycemic precautions discussed. rtc precautions given, and if any low symptoms - discussed plan.  Can also be seen by care provider in [place] if needed. Understanding expressed.   Patient Instructions  Stop Levemir. Start Lantus tomorrow morning - 20 units per day, then increase by 2 units every 2-3 days until blood sugars below 200. Once below 200 -  remai at that dose until follow up in next 3 weeks. Watch for low blood sugar symptoms as below. Return to the clinic or go to the nearest emergency room if any of your symptoms worsen or new symptoms occur. If any questions before you leave out of town - please call.  Hypoglycemia (Low Blood Sugar) Hypoglycemia is when the glucose (sugar) in your blood is too low. Hypoglycemia can happen for many reasons. It can happen to people with or without diabetes. Hypoglycemia can develop quickly and can be a medical emergency.  CAUSES  Having hypoglycemia does not mean that you will develop diabetes. Different causes include:  Missed or delayed meals or not  enough carbohydrates eaten.  Medication overdose. This could be by accident or deliberate. If by accident, your medication may need to be adjusted or changed.  Exercise or increased activity without adjustments in carbohydrates or medications.  A nerve disorder that affects body functions like your heart rate, blood pressure and digestion (autonomic neuropathy).  A condition where the stomach muscles do not function properly (gastroparesis). Therefore, medications may not absorb properly.  The inability to recognize the signs of hypoglycemia (hypoglycemic unawareness).  Absorption of insulin  may be altered.  Alcohol consumption.  Pregnancy/menstrual cycles/postpartum. This may be due to hormones.  Certain kinds of tumors. This is very rare. SYMPTOMS   Sweating.  Hunger.  Dizziness.  Blurred vision.  Drowsiness.  Weakness.  Headache.  Rapid heart beat.  Shakiness.  Nervousness. DIAGNOSIS  Diagnosis is made by monitoring blood glucose in one or all of the following ways:  Fingerstick blood glucose monitoring.  Laboratory results. TREATMENT  If you think your blood glucose is low:  Check your blood glucose, if possible. If it is less than 70 mg/dl, take one of the following:  3-4 glucose tablets.   cup juice (prefer clear like apple).   cup "regular" soda pop.  1 cup milk.  -1 tube of glucose gel.  5-6 hard candies.  Do not over treat because your blood glucose (sugar) will only go too high.  Wait 15 minutes and recheck your blood glucose. If it is still less than 70 mg/dl (or below your target range), repeat treatment.  Eat a snack if it is more than one hour until your next meal. Sometimes, your blood glucose may go so low that you are unable to treat yourself. You may need someone to help you. You may even pass out or be unable to swallow. This may require you to get an injection of glucagon, which raises the blood glucose. HOME CARE  INSTRUCTIONS  Check blood glucose as recommended by your caregiver.  Take medication as prescribed by your caregiver.  Follow your meal plan. Do not skip meals. Eat on time.  If you are going to drink alcohol, drink it only with meals.  Check your blood glucose before driving.  Check your blood glucose before and after exercise. If you exercise longer or different than usual, be sure to check blood glucose more frequently.  Always carry treatment with you. Glucose tablets are the easiest to carry.  Always wear medical alert jewelry or carry some form of identification that states that you have diabetes. This will alert people that you have diabetes. If you have hypoglycemia, they will have a better idea on what to do. SEEK MEDICAL CARE IF:   You are having problems keeping your blood sugar at target range.  You are having frequent episodes of hypoglycemia.  You  feel you might be having side effects from your medicines.  You have symptoms of an illness that is not improving after 3-4 days.  You notice a change in vision or a new problem with your vision. SEEK IMMEDIATE MEDICAL CARE IF:   You are a family member or friend of a person whose blood glucose goes below 70 mg/dl and is accompanied by:  Confusion.  A change in mental status.  The inability to swallow.  Passing out. Document Released: 05/29/2005 Document Revised: 08/21/2011 Document Reviewed: 09/25/2011 [facility] Patient Information 2014 [place], [place].

## 2013-02-01 NOTE — Progress Notes (Addendum)
  Subjective:    Patient ID: Eric Farley, male    DOB: Apr 26, 1976, 37 y.o.   MRN: [id number]  HPI    Review of Systems     Objective:   Physical Exam        Assessment & Plan:   Returned to discuss medications. Lantus and Levemir were both cost prohibitive with his current insurance plan. Due to current cost concerns, will start 70/30 insulin - 10 units BID AC.  Same instructions discussed in regards to increases - 2 units every 2 days until blood sugars under 200 - should be able to individualize doses based on home readings. Discussed may need to change to different regimen in future, but can discuss when returns from out of country. Hypoglycemic precautions again reviewed.  Plans on being evaluated by medical provider in [place] next week when he is there. Rtc/er precautions discussed.

## 2013-02-01 NOTE — Addendum Note (Signed)
Addended by: Meredith Staggers R on: 02/01/2013 06:18 PM   Modules accepted: Orders, Medications

## 2013-03-25 ENCOUNTER — Telehealth: Payer: Self-pay

## 2013-03-25 NOTE — Telephone Encounter (Signed)
Please clarify: Refill of Pradaxa requested, but this is usually for DVT or PE treatment - was he diagnosed with one of these, and when/where was this medication prescribed? Is he in town now, or still in [place] - last ov in 01/2102, planned on follow up in few weeks when returned from [place] for diabetes evaluation?  Additionally, Cialis refill requested, but what is the follow up plan, as at last ov, was to return in few weeks when returned from [place]?

## 2013-03-25 NOTE — Telephone Encounter (Signed)
[facility] reqs RFs of Tadalafil 20 mg #20, and Pradaxa 150 mg #60. The Rxs need to be faxed to [phone].

## 2013-03-26 NOTE — Telephone Encounter (Signed)
Called patient, no answer 

## 2013-04-01 NOTE — Telephone Encounter (Signed)
Ok thanks 

## 2013-04-01 NOTE — Telephone Encounter (Signed)
Pt indicates he does not need pradaxa, he has never used this medication he requested cialis. He states he will make appt, and was transferred to schedule the appointment. He is back in the area.

## 2013-04-01 NOTE — Telephone Encounter (Signed)
Tried to call pt again. LMOM for him to CB. Form for Rxs are in my box.

## 2013-04-07 ENCOUNTER — Encounter: Payer: Self-pay | Admitting: Family Medicine

## 2013-04-07 ENCOUNTER — Ambulatory Visit (INDEPENDENT_AMBULATORY_CARE_PROVIDER_SITE_OTHER): Payer: BC Managed Care – PPO | Admitting: Family Medicine

## 2013-04-07 VITALS — BP 132/88 | HR 82 | Temp 97.8°F | Resp 16 | Ht 69.0 in | Wt 248.0 lb

## 2013-04-07 DIAGNOSIS — E119 Type 2 diabetes mellitus without complications: Secondary | ICD-10-CM

## 2013-04-07 NOTE — Progress Notes (Signed)
Subjective:    Patient ID: Eric Farley, male    DOB: [birth year], 37 y.o.   MRN: [id number]  HPI Eric Farley is a 37 y.o. male  Here for follow up DM2.  see last ov on 02/01/13 prior to leaving for [place]. Lantus and Levemir were both cost prohibitive with his current insurance plan. Due to current cost concerns, started 70/30 insulin - 10 units BID AC with plan of increases - 2 units every 2 days until blood sugars under 200 - should be able to individualize doses based on home readingsplan to be evaluated by medical provider in [place], then to follow up with me in following 3 weeks. See phone call.   Per prior note with Eric Farley: Hx of DM2, uncontrolled with prior A1c in 07/2011 of 14.3. Apparently had been under care of endocrinologist at some point and had been on insulin, then discontinued due to improved control of DM2. Had been up to 45 units of basal insulin, but as blood sugars had improved, could stop insulin.  Has lost weight intentionally since Feb of this year. 270 to 222.  Today at 248.  A1c 14.0 on 01/30/13  In [place] was Rx Lantus 45 units once per day. Blood sugars were in 272, 241, 279, 328 - slowly came down to high 100's in mid September, then in last 30 days - averaging 121.  Lowest: 89. No symptomatic lows. Ran out of Lantus few weeks ago - now on 30-33 units twice per day depending on size of meal.  Feels well. Does carry sugar if needed, but no lows. Slight irritation at injection sites, but no other intolerances to medicines. Walking for exercise 2 times per day, inconsistent diet.   Review of Systems  Constitutional: Negative for fatigue and unexpected weight change.  Eyes: Negative for visual disturbance.  Respiratory: Negative for cough, chest tightness and shortness of breath.   Cardiovascular: Negative for chest pain, palpitations and leg swelling.  Gastrointestinal: Negative for abdominal pain and blood in stool.  Neurological: Negative for dizziness,  light-headedness and headaches.       Objective:   Physical Exam  Constitutional: He is oriented to person, place, and time. He appears well-developed and well-nourished.  HENT:  Head: Normocephalic and atraumatic.  Eyes: Pupils are equal, round, and reactive to light.  Cardiovascular: Normal rate, regular rhythm, normal heart sounds and intact distal pulses.   Pulmonary/Chest: Effort normal and breath sounds normal.  Abdominal: Soft. There is no tenderness.  Neurological: He is alert and oriented to person, place, and time.  Microfilament testing of feet normal bilaterally.  Skin: Skin is warm, dry and intact. No rash noted.  Psychiatric: He has a normal mood and affect. His behavior is normal.   Results for orders placed in visit on 04/07/13  GLUCOSE, POCT (MANUAL RESULT ENTRY)      Result Value Range   POC Glucose 107 (*) 70 - 99 mg/dl      Assessment & Plan:   Eric Farley is a 37 y.o. male Diabetes - Plan: POCT glucose (manual entry) - improving control based on home readings, but with few readings under 100, keep 70/30 insulin at 30 units BID for now. IF still having readings under 100 - decrease to 25 units BID. Hypoglycemic precautions reviewed.  Discussed need for insulin as opposed to different oral medications given insulin requirement, but can continue metformin 1000mg  BID.  Recheck in 1 month for HGB A1c. rtc precautions.  Meds ordered this encounter  Medications  . insulin NPH-regular (NOVOLIN 70/30) (70-30) 100 UNIT/ML injection    Sig: Inject 30 Units into the skin 2 (two) times daily with a meal. Inject 10 units twice per day with meals. Increase by 2 units every 2 days until blood sugars read below 200, then remain at that dose.   Patient Instructions  Stay at 30 units of 70/30 twice per day with meals, but if blood sugar readings below 100 - decrease doses to 25 units twice per day.  Keep candy/sugar with you at all times in case your blood sugar does  drop low. Recheck in 1 month. Return to the clinic or go to the nearest emergency room if any of your symptoms worsen or new symptoms occur.

## 2013-04-07 NOTE — Patient Instructions (Signed)
Stay at 30 units of 70/30 twice per day with meals, but if blood sugar readings below 100 - decrease doses to 25 units twice per day.  Keep candy/sugar with you at all times in case your blood sugar does drop low. Recheck in 1 month. Return to the clinic or go to the nearest emergency room if any of your symptoms worsen or new symptoms occur.

## 2013-04-14 NOTE — Progress Notes (Signed)
Appointment made with Dr Neva Seat on 12/8 @ 3:45pm.

## 2013-05-09 ENCOUNTER — Telehealth: Payer: Self-pay

## 2013-05-09 DIAGNOSIS — N529 Male erectile dysfunction, unspecified: Secondary | ICD-10-CM

## 2013-05-09 NOTE — Telephone Encounter (Signed)
[email or web address] sent a request for Rx of Tadalafil 20 mg. See prev 03/25/13 phone message for add'l info. The form is in [place] box, but Rx should probably be printed in [facility] and faxed w/it to [phone]. I have pended a Rx but did not change sig/quantity that came up in system. Please review and change as deemed appropriate.

## 2013-05-10 MED ORDER — TADALAFIL 20 MG PO TABS: 10.0000 mg | ORAL_TABLET | Freq: Every day | ORAL | Status: AC | PRN

## 2013-05-10 NOTE — Telephone Encounter (Signed)
Done, will have signed in next few days when in office.

## 2013-05-12 NOTE — Telephone Encounter (Signed)
faxed

## 2013-05-19 ENCOUNTER — Ambulatory Visit (INDEPENDENT_AMBULATORY_CARE_PROVIDER_SITE_OTHER): Payer: BC Managed Care – PPO | Admitting: Family Medicine

## 2013-05-19 ENCOUNTER — Encounter: Payer: Self-pay | Admitting: Family Medicine

## 2013-05-19 VITALS — BP 120/86 | HR 63 | Temp 98.9°F | Resp 16 | Ht 69.0 in | Wt 243.0 lb

## 2013-05-19 DIAGNOSIS — J301 Allergic rhinitis due to pollen: Secondary | ICD-10-CM

## 2013-05-19 DIAGNOSIS — E1165 Type 2 diabetes mellitus with hyperglycemia: Secondary | ICD-10-CM

## 2013-05-19 DIAGNOSIS — R0981 Nasal congestion: Secondary | ICD-10-CM

## 2013-05-19 DIAGNOSIS — J309 Allergic rhinitis, unspecified: Secondary | ICD-10-CM

## 2013-05-19 DIAGNOSIS — J3489 Other specified disorders of nose and nasal sinuses: Secondary | ICD-10-CM

## 2013-05-19 DIAGNOSIS — E1159 Type 2 diabetes mellitus with other circulatory complications: Secondary | ICD-10-CM

## 2013-05-19 LAB — POCT GLYCOSYLATED HEMOGLOBIN (HGB A1C): Hemoglobin A1C: 6.8

## 2013-05-19 LAB — GLUCOSE, POCT (MANUAL RESULT ENTRY): POC Glucose: 88 mg/dl (ref 70–99)

## 2013-05-19 MED ORDER — METFORMIN HCL 1000 MG PO TABS: 1000.0000 mg | ORAL_TABLET | Freq: Two times a day (BID) | ORAL | Status: DC

## 2013-05-19 MED ORDER — FLUTICASONE PROPIONATE 50 MCG/ACT NA SUSP: 2.0000 | Freq: Every day | NASAL | Status: AC

## 2013-05-19 MED ORDER — AMOXICILLIN-POT CLAVULANATE 875-125 MG PO TABS: 1.0000 | ORAL_TABLET | Freq: Two times a day (BID) | ORAL | Status: DC

## 2013-05-19 NOTE — Patient Instructions (Signed)
Diabetes control better today - continue metformin twice per day.  For congestion - suspect allergies, not sinus infection as improving. Try the nasal spray for allergies, but if not continuing to improve over next week to 10 days, or worsening sooner (more discolored congestion/face pain) - can fill antibiotic.  Return to the clinic or go to the nearest emergency room if any of your symptoms worsen or new symptoms occur.

## 2013-05-19 NOTE — Progress Notes (Signed)
Subjective:    Patient ID: JHADEN PIZZUTO, male    DOB: [birth year], 37 y.o.   MRN: [id number]  HPI  DASHUN BORRE is a 37 y.o. male  Congestion in sinuses past few weeks - getting better but still there, no fever, clear. Takes zyrtec for allergies - no relief.  Nasal sprays in past - not recent. No face pain/no tooth pain.  Tx: dayquil otc allergy sinus.   DM2 -  Off insulin past few weeks - only on meformin 100mg  BID.  Average CBG's - 115-117, 30 day avg 112.  No side effects of metformin. Weight stable.  Running 5d/week. Eating healthly.   Review of Systems     Objective:   Physical Exam  Vitals reviewed. Constitutional: He is oriented to person, place, and time. He appears well-developed and well-nourished.  HENT:  Head: Normocephalic and atraumatic.  Right Ear: Tympanic membrane, external ear and ear canal normal.  Left Ear: Tympanic membrane, external ear and ear canal normal.  Nose: No rhinorrhea. Right sinus exhibits no maxillary sinus tenderness and no frontal sinus tenderness. Left sinus exhibits no maxillary sinus tenderness and no frontal sinus tenderness.  Mouth/Throat: Oropharynx is clear and moist and mucous membranes are normal. No oropharyngeal exudate or posterior oropharyngeal erythema.  Eyes: Conjunctivae are normal. Pupils are equal, round, and reactive to light.  Neck: Neck supple.  Cardiovascular: Normal rate, regular rhythm, normal heart sounds and intact distal pulses.   No murmur heard. Pulmonary/Chest: Effort normal and breath sounds normal. He has no wheezes. He has no rhonchi. He has no rales.  Abdominal: Soft. There is no tenderness.  Lymphadenopathy:    He has no cervical adenopathy.  Neurological: He is alert and oriented to person, place, and time.  Microfilament testing of feet normal bilaterally.  Skin: Skin is warm, dry and intact. No rash noted.  Psychiatric: He has a normal mood and affect. His behavior is normal.       Results for orders placed in visit on 05/19/13  GLUCOSE, POCT (MANUAL RESULT ENTRY)      Result Value Range   POC Glucose 88  70 - 99 mg/dl  POCT GLYCOSYLATED HEMOGLOBIN (HGB A1C)      Result Value Range   Hemoglobin A1C 6.8         Assessment & Plan:   EAVEN SCHWAGER is a 37 y.o. male DM (diabetes mellitus), type 2 - now controlled on only metformin 1000mg  BID, diet and exercise. Commended him on efforts. No change in meds for now, recheck in 3 months.   Allergic rhinitis, Nasal sinus congestion - Plan: fluticasone (FLONASE) 50 MCG/ACT nasal spray for likely underlying allergy component, but as improving - viral cause aslo possible.  If not continuing to improve in next 7-10d, or more purulent congestion sooner with face/sinus pressure - Rx for  amoxicillin-clavulanate (AUGMENTIN) 875-125 MG per tablet given to fill.    Meds ordered this encounter  Medications  . fluticasone (FLONASE) 50 MCG/ACT nasal spray    Sig: Place 2 sprays into both nostrils daily.    Dispense:  16 g    Refill:  6  . metFORMIN (GLUCOPHAGE) 1000 MG tablet    Sig: Take 1 tablet (1,000 mg total) by mouth 2 (two) times daily with a meal.    Dispense:  180 tablet    Refill:  1    Order Specific Question:  Supervising Provider    Answer:  DOOLITTLE, ROBERT P [[id number]]  .  amoxicillin-clavulanate (AUGMENTIN) 875-125 MG per tablet    Sig: Take 1 tablet by mouth 2 (two) times daily.    Dispense:  20 tablet    Refill:  0   Patient Instructions  Diabetes control better today - continue metformin twice per day.  For congestion - suspect allergies, not sinus infection as improving. Try the nasal spray for allergies, but if not continuing to improve over next week to 10 days, or worsening sooner (more discolored congestion/face pain) - can fill antibiotic.  Return to the clinic or go to the nearest emergency room if any of your symptoms worsen or new symptoms occur.

## 2013-08-18 ENCOUNTER — Encounter: Payer: Self-pay | Admitting: Family Medicine

## 2013-08-18 ENCOUNTER — Ambulatory Visit (INDEPENDENT_AMBULATORY_CARE_PROVIDER_SITE_OTHER): Payer: BC Managed Care – PPO | Admitting: Family Medicine

## 2013-08-18 VITALS — BP 131/88 | HR 91 | Temp 98.3°F | Resp 16 | Ht 70.5 in | Wt 235.8 lb

## 2013-08-18 DIAGNOSIS — M25572 Pain in left ankle and joints of left foot: Secondary | ICD-10-CM

## 2013-08-18 DIAGNOSIS — E785 Hyperlipidemia, unspecified: Secondary | ICD-10-CM

## 2013-08-18 DIAGNOSIS — M25579 Pain in unspecified ankle and joints of unspecified foot: Secondary | ICD-10-CM

## 2013-08-18 DIAGNOSIS — I10 Essential (primary) hypertension: Secondary | ICD-10-CM

## 2013-08-18 DIAGNOSIS — E119 Type 2 diabetes mellitus without complications: Secondary | ICD-10-CM

## 2013-08-18 DIAGNOSIS — IMO0002 Reserved for concepts with insufficient information to code with codable children: Secondary | ICD-10-CM

## 2013-08-18 DIAGNOSIS — IMO0001 Reserved for inherently not codable concepts without codable children: Secondary | ICD-10-CM

## 2013-08-18 DIAGNOSIS — E1165 Type 2 diabetes mellitus with hyperglycemia: Secondary | ICD-10-CM

## 2013-08-18 LAB — COMPLETE METABOLIC PANEL WITH GFR
ALK PHOS: 22 U/L — AB (ref 39–117)
ALT: 39 U/L (ref 0–53)
AST: 20 U/L (ref 0–37)
Albumin: 4.6 g/dL (ref 3.5–5.2)
BILIRUBIN TOTAL: 0.6 mg/dL (ref 0.2–1.2)
BUN: 13 mg/dL (ref 6–23)
CO2: 25 meq/L (ref 19–32)
CREATININE: 0.94 mg/dL (ref 0.50–1.35)
Calcium: 9.7 mg/dL (ref 8.4–10.5)
Chloride: 99 mEq/L (ref 96–112)
GFR, Est Non African American: >89 mL/min
Glucose, Bld: 91 mg/dL (ref 70–99)
Potassium: 3.9 mEq/L (ref 3.5–5.3)
SODIUM: 136 meq/L (ref 135–145)
TOTAL PROTEIN: 7.6 g/dL (ref 6.0–8.3)

## 2013-08-18 LAB — LIPID PANEL
CHOL/HDL RATIO: 6.2 ratio
Cholesterol: 222 mg/dL — ABNORMAL HIGH (ref 0–200)
HDL: 36 mg/dL — AB (ref >39)
LDL CALC: 115 mg/dL — AB (ref 0–99)
TRIGLYCERIDES: 354 mg/dL — AB (ref <150)
VLDL: 71 mg/dL — ABNORMAL HIGH (ref 0–40)

## 2013-08-18 LAB — POCT GLYCOSYLATED HEMOGLOBIN (HGB A1C): HEMOGLOBIN A1C: 6

## 2013-08-18 LAB — GLUCOSE, POCT (MANUAL RESULT ENTRY): POC Glucose: 78 mg/dl (ref 70–99)

## 2013-08-18 MED ORDER — ATORVASTATIN CALCIUM 10 MG PO TABS
10.0000 mg | ORAL_TABLET | Freq: Every day | ORAL | Status: AC
Start: 2013-08-18 — End: ?

## 2013-08-18 MED ORDER — LISINOPRIL-HYDROCHLOROTHIAZIDE 10-12.5 MG PO TABS: 1.0000 | ORAL_TABLET | Freq: Every day | ORAL | Status: DC

## 2013-08-18 MED ORDER — METFORMIN HCL 850 MG PO TABS: 850.0000 mg | ORAL_TABLET | Freq: Two times a day (BID) | ORAL | Status: DC

## 2013-08-18 NOTE — Progress Notes (Addendum)
Subjective:  This chart was scribed for Eric Ray, MD by Eric Farley, ED scribe.  This patient was seen in Room 22 and the patient's care was started at 4:35 PM. Authored by Eric Forehand, MD - unable to change in [facility].    Patient ID: Eric Farley, male    DOB: 1976-02-03, 38 y.o.   MRN: [id number]  Chief Complaint  Patient presents with   Diabetes   Hypertension   paper work    paper work for handicap placard    HPI  Eric Farley is a 38 y.o. male PCP: [facility], PA-C   Pt presents for f/u with HTN and DM.  Last office visit was May 19 2013.    1. DM - At last office visit on 05/19/13 pt had been off insulin at that time and was only on metformin 1000mg  2x/day due to control of blood sugars.  Hemoglobin-A1C 6.8 at that office visit.  No change in meds.  He reports his blood sugar has been ranging from 100-130 at home.  He states it has not dropped below 100.  He has been attempting to eat well and exercise.  He goes to the gym for 2 hours 5x/week and denies any issues with exercising.  Pt last saw an ophthalmologist one year ago and has an appointment this week.  He saw a dentist within the past month.  2. HTN - He takes lisonopril-hctz 10-12.5mg  q.d.  Last CMP in August 2014: creatinine 1.1.  Last lipid panel in August 2014: total cholesterol 330, trigs 1,128, but blood sugar also elevated at that time, with glucose 430.  He reports his BP at home has consistently been reading at around 140/80-90.  He is not on cholesterol medications.  He used to be on Lipitor but was taken off several years ago.  He does not remember why he was taken off.  He does not take aspirin.  He denies family h/o MI.  3. Pt also requests paperwork to renew his handicap permit.  He has the permit due to chronic left ankle pain which limits his ability to walk long distances.  He has h/o fracture and surgery to that ankle.  4. Pt notes that he has occasional mild decreased hearing and  he is concerned he may have wax in his ears.  He is wondering whether he should have his ears flushed.   Patient Active Problem List   Diagnosis Date Noted   Erectile dysfunction 01/30/2013   DM (diabetes mellitus), type 2, uncontrolled    Sleep apnea    HTN (hypertension)    Hypercholesterolemia    Past Medical History  Diagnosis Date   Sleep apnea     does not like CPAP   HTN (hypertension)    Hypercholesterolemia    Diabetes mellitus    Past Surgical History  Procedure Laterality Date   Ankle fracture surgery  2007   Lithotripsy     Fracture surgery     No Known Allergies Prior to Admission medications   Medication Sig Start Date End Date Taking? Authorizing Provider  fluticasone (FLONASE) 50 MCG/ACT nasal spray Place 2 sprays into both nostrils daily. 05/19/13  Yes Eric Agreste, MD  insulin NPH-regular (NOVOLIN 70/30) (70-30) 100 UNIT/ML injection Inject 30 Units into the skin 2 (two) times daily with a meal. Inject 10 units twice per day with meals. Increase by 2 units every 2 days until blood sugars read below 200, then remain at that dose.  02/01/13  Yes Eric Agreste, MD  Insulin Pen Needle 31G X 8 MM MISC 1 each by Does not apply route daily. 01/30/13  Yes Eric S Jeffery, PA-C  lisinopril-hydrochlorothiazide (PRINZIDE,ZESTORETIC) 10-12.5 MG per tablet Take 1 tablet by mouth daily. 01/30/13  Yes Eric S Jeffery, PA-C  metFORMIN (GLUCOPHAGE) 1000 MG tablet Take 1 tablet (1,000 mg total) by mouth 2 (two) times daily with a meal. 05/19/13  Yes Eric Agreste, MD  omeprazole (PRILOSEC) 20 MG capsule Take 2 capsules (40 mg total) by mouth daily. 01/30/13  Yes Eric S Jeffery, PA-C  tadalafil (CIALIS) 20 MG tablet Take 0.5-1 tablets (10-20 mg total) by mouth daily as needed for erectile dysfunction. 05/09/13  Yes Eric Agreste, MD  vardenafil (LEVITRA) 10 MG tablet Take 1 tablet (10 mg total) by mouth daily as needed for erectile dysfunction. 01/31/13  Yes  Eric Sers, PA-C   History   Social History   Marital Status: Divorced    Spouse Name: n/a    Number of Children: 1   Years of Education: Master's   [place] [place] [employer]   Social History Main Topics   Smoking status: Never Smoker    Smokeless tobacco: Never Used   Alcohol Use: 1.5 - 2 oz/week    3-4 drink(s) per week     Comment: wine cooler   Drug Use: No   Sexual Activity: Yes   Other Topics Concern   Not on file   Social History Narrative   Divorced. Lives alone. His daughter lives with her mother (not his ex-wife). Working on his PhD in [employer]. Originally from [place], parents still live there, has lived in the [place] since 1994.   Walks the dog 30 min x 2 each day.      Review of Systems  Constitutional: Negative for fatigue and unexpected weight change.  HENT: Positive for hearing loss (occasional mild hearing loss, due to possible wax).   Eyes: Negative for visual disturbance.  Respiratory: Negative for cough, chest tightness and shortness of breath.   Cardiovascular: Negative for chest pain, palpitations and leg swelling.  Gastrointestinal: Negative for abdominal pain and blood in stool.  Musculoskeletal: Positive for arthralgias (left ankle).  Neurological: Negative for dizziness, light-headedness and headaches.         Objective:   Physical Exam  Vitals reviewed. Constitutional: He is oriented to person, place, and time. He appears well-developed and well-nourished.  HENT:  Head: Normocephalic and atraumatic.  Right Ear: Tympanic membrane and ear canal normal.  Left Ear: Tympanic membrane and ear canal normal.  TMs pearly gray, canals clear  Eyes: EOM are normal. Pupils are equal, round, and reactive to light.  Neck: No JVD present. Carotid bruit is not present.  Cardiovascular: Normal rate, regular rhythm and normal heart sounds.   No murmur heard. Pulmonary/Chest: Effort  normal and breath sounds normal. He has no rales.  Musculoskeletal: He exhibits no edema.  Left ankle: well-healed scar to medial and lateral ankle.  Slightly decreased dorsiflexion.  Otherwise full ROM.  No focal tenderness.  Neurological: He is alert and oriented to person, place, and time.  Skin: Skin is warm and dry.  Psychiatric: He has a normal mood and affect.     Filed Vitals:   08/18/13 1608  BP: 131/88  Pulse: 91  Temp: 98.3 F (36.8 C)  TempSrc: Oral  Resp: 16  Height: 5' 10.5" (  1.791 m)  Weight: 235 lb 12.8 oz (106.958 kg)  SpO2: 98%     Results for orders placed in visit on 08/18/13  GLUCOSE, POCT (MANUAL RESULT ENTRY)      Result Value Ref Range   POC Glucose 78  70 - 99 mg/dl  POCT GLYCOSYLATED HEMOGLOBIN (HGB A1C)      Result Value Ref Range   Hemoglobin A1C 6.0          Assessment & Plan:   Eric Farley is a 38 y.o. male HTN (hypertension) - Plan: Lipid panel, lisinopril-hydrochlorothiazide (PRINZIDE,ZESTORETIC) 10-12.5 MG per tablet - controlled. No med changes.  DM (diabetes mellitus), type 2, uncontrolled - Plan: POCT glucose (manual entry), POCT glycosylated hemoglobin (Hb A1C), COMPLETE METABOLIC PANEL WITH GFR, metFORMIN (GLUCOPHAGE) 850 MG tablet, HM Diabetes Foot Exam - controlled. Will try to decrease metformin to 850mg  bid. recheck 3 months.   Other and unspecified hyperlipidemia - Plan: atorvastatin (LIPITOR) 10 MG tablet refilled.   Left ankle pain - chronic , post surgical.  Handicap plackard completed.    Meds ordered this encounter  Medications   metFORMIN (GLUCOPHAGE) 850 MG tablet    Sig: Take 1 tablet (850 mg total) by mouth 2 (two) times daily with a meal.    Dispense:  180 tablet    Refill:  1    Order Specific Question:  Supervising Provider    Answer:  DOOLITTLE, ROBERT P [[id number]]   lisinopril-hydrochlorothiazide (PRINZIDE,ZESTORETIC) 10-12.5 MG per tablet    Sig: Take 1 tablet by mouth daily.    Dispense:  90 tablet     Refill:  1    Order Specific Question:  Supervising Provider    Answer:  DOOLITTLE, ROBERT P [[id number]]   atorvastatin (LIPITOR) 10 MG tablet    Sig: Take 1 tablet (10 mg total) by mouth daily.    Dispense:  90 tablet    Refill:  1   Patient Instructions  You should receive a call or letter about your lab results within the next week to 10 days.  Start Lipitor one per day. Depending on lab results - may need higher dose.  Change to 850mg  twice per day for metformin.  recheck in 3 months.  Return to the clinic or go to the nearest emergency room if any of your symptoms worsen or new symptoms occur.    I personally performed the services described in this documentation, which was scribed in my presence. The recorded information has been reviewed and considered, and addended by me as needed.

## 2013-08-18 NOTE — Patient Instructions (Signed)
You should receive a call or letter about your lab results within the next week to 10 days.  Start Lipitor one per day. Depending on lab results - may need higher dose.  Change to 850mg  twice per day for metformin.  recheck in 3 months.  Return to the clinic or go to the nearest emergency room if any of your symptoms worsen or new symptoms occur.

## 2013-08-22 ENCOUNTER — Telehealth: Payer: Self-pay

## 2013-08-22 NOTE — Telephone Encounter (Signed)
PA needed for cialis. Called pt to verify h/o other meds tried and he stated that he tried Viagra and it was not effective. Levitra was effective but the cost for Cialis was lower and it is also effective. Completed covermymeds.

## 2013-08-25 NOTE — Telephone Encounter (Signed)
PA approved through 06/11/2038. Notified pharm and pt. 

## 2013-10-07 ENCOUNTER — Other Ambulatory Visit: Payer: Self-pay | Admitting: Physician Assistant

## 2013-12-29 ENCOUNTER — Other Ambulatory Visit: Payer: Self-pay | Admitting: Family Medicine

## 2014-01-01 ENCOUNTER — Other Ambulatory Visit: Payer: Self-pay

## 2014-01-01 NOTE — Telephone Encounter (Signed)
Opened in error, duplicate.

## 2014-01-27 ENCOUNTER — Telehealth: Payer: Self-pay | Admitting: *Deleted

## 2014-01-27 NOTE — Telephone Encounter (Signed)
Called and left voicemail on patients home number regarding diabetes follow-up.

## 2014-03-13 ENCOUNTER — Telehealth: Payer: Self-pay

## 2014-03-13 NOTE — Telephone Encounter (Signed)
Clld pt - Ethete on cell to schedule Diabetes Care Follow up appt.
